# Patient Record
Sex: Female | Born: 1980 | Race: Black or African American | Hispanic: No | Marital: Married | Smoking: Never smoker
Health system: Southern US, Community
[De-identification: ages and names within clinical notes are randomized; demographics above are authoritative.]

## PROBLEM LIST (undated history)

## (undated) ENCOUNTER — Inpatient Hospital Stay (HOSPITAL_COMMUNITY): Payer: Self-pay

## (undated) DIAGNOSIS — Z789 Other specified health status: Secondary | ICD-10-CM

---

## 2013-10-06 DIAGNOSIS — Z9889 Other specified postprocedural states: Secondary | ICD-10-CM

## 2013-10-06 HISTORY — PX: DILATION AND CURETTAGE OF UTERUS: SHX78

## 2013-10-06 HISTORY — DX: Other specified postprocedural states: Z98.890

## 2017-01-19 ENCOUNTER — Ambulatory Visit: Payer: Self-pay | Admitting: Internal Medicine

## 2017-02-09 ENCOUNTER — Encounter: Payer: Self-pay | Admitting: Internal Medicine

## 2017-02-09 ENCOUNTER — Ambulatory Visit (INDEPENDENT_AMBULATORY_CARE_PROVIDER_SITE_OTHER): Payer: Self-pay | Admitting: Internal Medicine

## 2017-02-09 VITALS — BP 122/80 | HR 66 | Resp 12 | Ht 64.0 in | Wt 169.0 lb

## 2017-02-09 DIAGNOSIS — E663 Overweight: Secondary | ICD-10-CM | POA: Insufficient documentation

## 2017-02-09 MED ORDER — PRENATAL PLUS 27-1 MG PO TABS: 1.0000 | ORAL_TABLET | Freq: Every day | ORAL | 11 refills | Status: DC

## 2017-02-09 NOTE — Progress Notes (Signed)
   Subjective:    Patient ID: Ann Barr, female    DOB: 01/05/1981, 36 y.o.   MRN: [id number]  HPI   Originally from [place]  1.  Overweight:    Breakfast:  2% milk, coffee and white bread with some sort of sugar or a croissant.  Lunch:  White rice, couscous, tomato sauce with chicken or fish--baked.  Salad with 3 veggies, mayo with oil. Bread-white again  Dinner:  Same as lunch basically.  Drinks OJ once daily and rest is water.  Popcorn as a snack. Milk at bedtime.  Eat as a family much of the time  No outpatient prescriptions have been marked as taking for the 02/09/17 encounter (Office Visit) with Ann Barr, Ann Arp, MD.    No Known Allergies   No past medical history on file.   Past Surgical History:  Procedure Laterality Date  . 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   Social History   Social History  . Marital status: Married    Spouse name: Ann Barr  . Number of children: 4  . Years of education: N/A   Occupational History  . Housewife    Social History Main Topics  . Smoking status: Never Smoker  . Smokeless tobacco: Never Used  . Alcohol use Not on file  . Drug use: Unknown  . Sexual activity: Yes    Birth control/ protection: None   Other Topics Concern  . Not on file   Social History Narrative   [place]   [place]   Lives at home with husband, Ann SouHarouna, also a patient here and their 4 children.      Review of Systems     Objective:   Physical Exam  Lungs:  CTA CV:  RRR with normal S1 and S2, No S3, S4 or murmur, radial and DP pulses normal and equal Abd:  S, NT, No HSM or mass, + BS.  Low transverse surgical scar. LE:  No edema        Assessment & Plan:  1.  Establishing care.  2.  Concern for being able to get pregnant with history of surgery.  Appears to have had essentially a C section to treat  hemorrhaging from abruptio placenta (based on patient's history and physical.   As having unprotected sex, asked she take a PNV daily. Appears she should be fine for getting pregnant again.  3.  Overweight:  Encouraged lifestyle changes.  FLP, CMP, CBC  1-2 weeks fasting.

## 2017-02-09 NOTE — Patient Instructions (Signed)
Drink a glass of water before every meal Drink 6-8 glasses of water daily Eat three meals daily Eat a protein and healthy fat with every meal (eggs,fish, chicken, turkey and limit red meats) Eat 5 servings of vegetables daily, mix the colors Eat 2 servings of fruit daily with skin, if skin is edible Use smaller plates Put food/utensils down as you chew and swallow each bite Eat at a table with friends/family at least once daily, no TV Do not eat in front of the TV 

## 2017-02-12 ENCOUNTER — Telehealth: Payer: Self-pay | Admitting: Licensed Clinical Social Worker

## 2017-02-12 NOTE — Telephone Encounter (Signed)
LCSW called pt to introduce social work services at the clinic. Pt reported that she did not have any needs at this time.

## 2017-02-16 ENCOUNTER — Other Ambulatory Visit (INDEPENDENT_AMBULATORY_CARE_PROVIDER_SITE_OTHER): Payer: Self-pay

## 2017-02-16 DIAGNOSIS — E663 Overweight: Secondary | ICD-10-CM

## 2017-02-17 LAB — LIPID PANEL W/O CHOL/HDL RATIO
CHOLESTEROL TOTAL: 169 mg/dL (ref 100–199)
HDL: 64 mg/dL (ref >39)
LDL CALC: 94 mg/dL (ref 0–99)
TRIGLYCERIDES: 55 mg/dL (ref 0–149)
VLDL CHOLESTEROL CAL: 11 mg/dL (ref 5–40)

## 2017-02-17 LAB — CBC WITH DIFFERENTIAL/PLATELET
BASOS ABS: 0 10*3/uL (ref 0.0–0.2)
Basos: 1 %
EOS (ABSOLUTE): 0.2 10*3/uL (ref 0.0–0.4)
Eos: 3 %
Hematocrit: 35.6 % (ref 34.0–46.6)
Hemoglobin: 11.6 g/dL (ref 11.1–15.9)
IMMATURE GRANULOCYTES: 0 %
Immature Grans (Abs): 0 10*3/uL (ref 0.0–0.1)
LYMPHS ABS: 3 10*3/uL (ref 0.7–3.1)
Lymphs: 53 %
MCH: 27.8 pg (ref 26.6–33.0)
MCHC: 32.6 g/dL (ref 31.5–35.7)
MCV: 85 fL (ref 79–97)
Monocytes Absolute: 0.4 10*3/uL (ref 0.1–0.9)
Monocytes: 8 %
NEUTROS ABS: 1.9 10*3/uL (ref 1.4–7.0)
Neutrophils: 35 %
PLATELETS: 309 10*3/uL (ref 150–379)
RBC: 4.18 x10E6/uL (ref 3.77–5.28)
RDW: 13.7 % (ref 12.3–15.4)
WBC: 5.5 10*3/uL (ref 3.4–10.8)

## 2017-02-17 LAB — COMPREHENSIVE METABOLIC PANEL
ALK PHOS: 60 IU/L (ref 39–117)
ALT: 15 IU/L (ref 0–32)
AST: 17 IU/L (ref 0–40)
Albumin/Globulin Ratio: 1.5 (ref 1.2–2.2)
Albumin: 4.6 g/dL (ref 3.5–5.5)
BUN/Creatinine Ratio: 15 (ref 9–23)
BUN: 11 mg/dL (ref 6–20)
Bilirubin Total: 1.1 mg/dL (ref 0.0–1.2)
CALCIUM: 9.7 mg/dL (ref 8.7–10.2)
CO2: 27 mmol/L (ref 18–29)
CREATININE: 0.74 mg/dL (ref 0.57–1.00)
Chloride: 100 mmol/L (ref 96–106)
GFR calc Af Amer: 120 mL/min/{1.73_m2} (ref >59)
GFR, EST NON AFRICAN AMERICAN: 104 mL/min/{1.73_m2} (ref >59)
GLUCOSE: 91 mg/dL (ref 65–99)
Globulin, Total: 3.1 g/dL (ref 1.5–4.5)
Potassium: 4.6 mmol/L (ref 3.5–5.2)
Sodium: 139 mmol/L (ref 134–144)
Total Protein: 7.7 g/dL (ref 6.0–8.5)

## 2017-05-11 ENCOUNTER — Encounter: Payer: Self-pay | Admitting: Internal Medicine

## 2017-05-11 ENCOUNTER — Ambulatory Visit (INDEPENDENT_AMBULATORY_CARE_PROVIDER_SITE_OTHER): Payer: Self-pay | Admitting: Internal Medicine

## 2017-05-11 VITALS — BP 122/78 | HR 66 | Resp 12 | Ht 64.0 in | Wt 165.0 lb

## 2017-05-11 DIAGNOSIS — B353 Tinea pedis: Secondary | ICD-10-CM

## 2017-05-11 DIAGNOSIS — Z124 Encounter for screening for malignant neoplasm of cervix: Secondary | ICD-10-CM

## 2017-05-11 DIAGNOSIS — Z0000 Encounter for general adult medical examination without abnormal findings: Secondary | ICD-10-CM

## 2017-05-11 DIAGNOSIS — E663 Overweight: Secondary | ICD-10-CM

## 2017-05-11 DIAGNOSIS — E010 Iodine-deficiency related diffuse (endemic) goiter: Secondary | ICD-10-CM

## 2017-05-11 DIAGNOSIS — Z9189 Other specified personal risk factors, not elsewhere classified: Secondary | ICD-10-CM

## 2017-05-11 MED ORDER — TERBINAFINE HCL 1 % EX CREA: 1.0000 "application " | TOPICAL_CREAM | Freq: Two times a day (BID) | CUTANEOUS | 0 refills | Status: DC

## 2017-05-11 NOTE — Progress Notes (Signed)
Subjective:    Patient ID: Ann Barr, female    DOB: 10/24/1980, 36 y.o.   MRN: [id number]  HPI   CPE with pap  1.  Pap: Never.  No family history.  2.  Mammogram:  Checked in 2011 for left breast pain.  No abnormal findings.  No family history.  3.  Osteoprevention:  2 servings of milk daily.  Walks 30-40 minutes 3-4 times weekly.    4.  Guaiac Cards:  Never  5.  Colonoscopy:  Never.  No family history.  6.  Immunizations:  Not sure when her last tetanus vaccine.  Does have an immunization record at home.  7.  Glucose/Cholesterol: Serum Glucose and cholesterol panels were fine in May of this year.  Current Meds  Medication Sig  . prenatal vitamin w/FE, FA (PRENATAL 1 + 1) 27-1 MG TABS tablet Take 1 tablet by mouth daily at 12 noon.    No Known Allergies   History reviewed. No pertinent past medical history.   Past Surgical History:  Procedure Laterality Date  . CESAREAN SECTION  2015   Fetal demise with what sounds like abruptio placenta    History reviewed. No pertinent family history.   Social History   Social History  . Marital status: Married    Spouse name: [name]  . Number of children: 4  . 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 Smoking status: Never Smoker  . Smokeless tobacco: Never Used  . Alcohol use No  . Drug use: No  . Sexual activity: Yes    Birth control/ protection: None   Other Topics Concern  . Not on file   Social History Narrative   Originally from [place]   [place]   Lives at home with husband, Placido SouHarouna, also a patient here and their 4 children.     Review of Systems  Constitutional: Negative for appetite change, fatigue and unexpected weight change.  HENT: Negative for dental problem, ear pain, hearing loss, rhinorrhea, sinus pressure, sneezing and sore throat.   Eyes: Positive for visual  disturbance (wears glasses.  Last eye check was 4 years.  ).  Respiratory: Negative for cough and shortness of breath.   Cardiovascular: Negative for chest pain, palpitations and leg swelling.  Gastrointestinal: Negative for abdominal pain, blood in stool, constipation, diarrhea, nausea and vomiting.  Genitourinary: Negative for dysuria, frequency, menstrual problem (Generally lasting up to 8 days.  Was on BCPs before which caused to be shorter, but [facility] and vaginal discharge.  Musculoskeletal: Negative for arthralgias.  Skin: Positive for rash (white areas and itching in between toes . Has treated with medications she cannot recall.  Different creams. Husband with similar findings on feet.).  Neurological: Negative for weakness and numbness.  Hematological: Does not bruise/bleed easily.  Psychiatric/Behavioral: Negative for dysphoric mood. The patient is not nervous/anxious.        Objective:   Physical Exam  Constitutional: She is oriented to person, place, and time. She appears well-developed and well-nourished.  HENT:  Head: Normocephalic and atraumatic.  Right Ear: Hearing, tympanic membrane, external ear and ear canal normal.  Left Ear: Hearing, tympanic membrane, external ear and ear canal normal.  Nose: Nose normal.  Mouth/Throat: Uvula is midline, oropharynx is clear and moist and mucous membranes are normal.  Eyes: Pupils are equal, round, and reactive  to light. Conjunctivae and EOM are normal.  Discs sharp bilaterally  Neck: Normal range of motion and full passive range of motion without pain. Neck supple. Thyromegaly (no palpable mass, just generous in size) present. No thyroid mass present.  Cardiovascular: Normal rate, regular rhythm, S1 normal and S2 normal.  Exam reveals no S3, no S4 and no friction rub.   No murmur heard. Carotid, radial, femoral, DP and PT pulses normal and equal.   Pulmonary/Chest: Effort normal and breath sounds normal.  Right breast exhibits no inverted nipple, no mass, no nipple discharge, no skin change and no tenderness. Left breast exhibits no inverted nipple, no mass, no nipple discharge, no skin change and no tenderness.  Abdominal: Soft. Bowel sounds are normal. She exhibits no mass. There is no hepatosplenomegaly. There is no tenderness. No hernia.  Genitourinary:  Genitourinary Comments: Normal external genitalia.  No vaginal discharge.  No cervical lesions or CMT.  No uterine or adnexal mass or tenderness.   Musculoskeletal: Normal range of motion.  Lymphadenopathy:       Head (right side): No submental and no submandibular adenopathy present.       Head (left side): No submental and no submandibular adenopathy present.    She has no cervical adenopathy.    She has no axillary adenopathy.       Right: No inguinal and no supraclavicular adenopathy present.       Left: No inguinal and no supraclavicular adenopathy present.  Neurological: She is alert and oriented to person, place, and time. She has normal strength and normal reflexes. No cranial nerve deficit or sensory deficit. Coordination and gait normal.  Skin: Skin is warm and dry.  White, soft, fissured skin in between all toes of both feet.  Some scaling and dryness along lateral plantar aspects of both feet.  Psychiatric: She has a normal mood and affect. Her speech is normal and behavior is normal. Judgment and thought content normal. Cognition and memory are normal.          Assessment & Plan:  1.  CPE with pap She will check her papers at home regarding immunizations.  Not clear she is up to date on Td/Tdap  2.  Overweight:  Encouraged to work on diet and physical activity.  3.  Tinea pedis:  Terbinafine cream 0.1% twice daily for at least 14 days or until skin changes resolve.  To clean inside of shoes regularly with Lysol wipes and shower floor with bleach containing cleaner.  4.  Dental care:  Referral to dental clinic.  5.   Vision:  Checking with husband about $69 cost to see about optometry.

## 2017-05-11 NOTE — Patient Instructions (Addendum)
Drink a glass of water before every meal Drink 6-8 glasses of water daily Eat three meals daily Eat a protein and healthy fat with every meal (eggs,fish, chicken, [place] and limit red meats) Eat 5 servings of vegetables daily, mix the colors Eat 2 servings of fruit daily with skin, if skin is edible Use smaller plates Put food/utensils down as you chew and swallow each bite Eat at a table with friends/family at least once daily, no TV Do not eat in front of the TV  Tea Tree oil twice daily to dry feet--dry areas.

## 2017-05-12 LAB — TSH: TSH: 1.07 u[IU]/mL (ref 0.450–4.500)

## 2017-05-12 LAB — CYTOLOGY - PAP

## 2018-10-23 ENCOUNTER — Inpatient Hospital Stay (HOSPITAL_COMMUNITY)
Admission: AD | Admit: 2018-10-23 | Discharge: 2018-10-23 | Disposition: A | Discharge status: Home / Self Care | Payer: Medicaid Other | Source: Ambulatory Visit | Attending: Obstetrics and Gynecology | Admitting: Obstetrics and Gynecology

## 2018-10-23 ENCOUNTER — Encounter (HOSPITAL_COMMUNITY): Payer: Self-pay

## 2018-10-23 ENCOUNTER — Inpatient Hospital Stay (HOSPITAL_COMMUNITY): Payer: Medicaid Other

## 2018-10-23 DIAGNOSIS — R109 Unspecified abdominal pain: Secondary | ICD-10-CM | POA: Insufficient documentation

## 2018-10-23 DIAGNOSIS — O3680X0 Pregnancy with inconclusive fetal viability, not applicable or unspecified: Secondary | ICD-10-CM | POA: Diagnosis not present

## 2018-10-23 DIAGNOSIS — Z8759 Personal history of other complications of pregnancy, childbirth and the puerperium: Secondary | ICD-10-CM

## 2018-10-23 DIAGNOSIS — O26891 Other specified pregnancy related conditions, first trimester: Secondary | ICD-10-CM | POA: Diagnosis not present

## 2018-10-23 DIAGNOSIS — Z3A14 14 weeks gestation of pregnancy: Secondary | ICD-10-CM | POA: Diagnosis not present

## 2018-10-23 DIAGNOSIS — O209 Hemorrhage in early pregnancy, unspecified: Secondary | ICD-10-CM | POA: Insufficient documentation

## 2018-10-23 HISTORY — DX: Other specified health status: Z78.9

## 2018-10-23 LAB — CBC
HCT: 35.5 % — ABNORMAL LOW (ref 36.0–46.0)
Hemoglobin: 11.9 g/dL — ABNORMAL LOW (ref 12.0–15.0)
MCH: 29 pg (ref 26.0–34.0)
MCHC: 33.5 g/dL (ref 30.0–36.0)
MCV: 86.6 fL (ref 80.0–100.0)
PLATELETS: 262 10*3/uL (ref 150–400)
RBC: 4.1 MIL/uL (ref 3.87–5.11)
RDW: 12.2 % (ref 11.5–15.5)
WBC: 9.4 10*3/uL (ref 4.0–10.5)
nRBC: 0 % (ref 0.0–0.2)

## 2018-10-23 LAB — HCG, QUANTITATIVE, PREGNANCY: hCG, Beta Chain, Quant, S: 814 m[IU]/mL — ABNORMAL HIGH (ref <5)

## 2018-10-23 LAB — WET PREP, GENITAL
Clue Cells Wet Prep HPF POC: NONE SEEN
Sperm: NONE SEEN
Trich, Wet Prep: NONE SEEN
YEAST WET PREP: NONE SEEN

## 2018-10-23 LAB — URINALYSIS, ROUTINE W REFLEX MICROSCOPIC
Bilirubin Urine: NEGATIVE
GLUCOSE, UA: NEGATIVE mg/dL
Ketones, ur: NEGATIVE mg/dL
NITRITE: NEGATIVE
PH: 8 (ref 5.0–8.0)
PROTEIN: 30 mg/dL — AB
Specific Gravity, Urine: 1.011 (ref 1.005–1.030)

## 2018-10-23 LAB — POCT PREGNANCY, URINE: Preg Test, Ur: POSITIVE — AB

## 2018-10-23 LAB — ABO/RH: ABO/RH(D): O POS

## 2018-10-23 MED ORDER — TRAMADOL HCL 50 MG PO TABS: 100.0000 mg | ORAL_TABLET | Freq: Four times a day (QID) | ORAL | 0 refills | Status: DC | PRN

## 2018-10-23 MED ORDER — TRAMADOL HCL 50 MG PO TABS
100.0000 mg | ORAL_TABLET | Freq: Once | ORAL | Status: AC
  Administered 2018-10-23: 100 mg via ORAL
  Filled 2018-10-23: qty 2

## 2018-10-23 NOTE — Discharge Instructions (Signed)
Threatened Miscarriage    A threatened miscarriage is when you have bleeding from your vagina during the first 20 weeks of pregnancy but the pregnancy does not end. Your doctor will do tests to make sure you are still pregnant. The cause of the bleeding may not be known. This condition does not mean your pregnancy will end, but it does increase the risk that it will end (complete miscarriage).  Follow these instructions at home:  · Get plenty of rest.  · If you have bleeding in your vagina, do not have sex or use tampons.  · Do not douche.  · Do not smoke or use drugs.  · Do not drink alcohol.  · Avoid caffeine.  · Keep all follow-up prenatal visits as told by your doctor. This is important.  Contact a doctor if:  · You have light bleeding from your vagina.  · You have belly pain or cramping.  · You have a fever.  Get help right away if:  · You have heavy bleeding from your vagina.  · You have clots of blood coming from your vagina.  · You pass tissue from your vagina.  · You have a gush of fluid from your vagina.  · You are leaking fluid from your vagina.  · You have very bad pain or cramps in your low back or belly.  · You have fever, chills, and very bad belly pain.  Summary  · A threatened miscarriage is when you have bleeding from your vagina during the first 20 weeks of pregnancy but the pregnancy does not end.  · This condition does not mean your pregnancy will end, but it does increase the risk that it will end (complete miscarriage).  · Get plenty of rest. If you have bleeding in your vagina, do not have sex or use tampons.  · Keep all follow-up prenatal visits as told by your doctor. This is important.  This information is not intended to replace advice given to you by your health care provider. Make sure you discuss any questions you have with your health care provider.  Document Released: 09/04/2008 Document Revised: 12/19/2016 Document Reviewed: 12/19/2016  Elsevier Interactive Patient Education © 2019  [employer] Inc.

## 2018-10-23 NOTE — MAU Note (Signed)
Ann Barr is a 38 y.o.  here in [facility]U reporting:  +vaginal bleeding. Dark in color. Noticeable when wipes. Not having to wear a pad.  Denies recent IC. LMP: 07/13/18. Had first OB visit at "1100" office on Tuesday. Has another appointment this coming Tuesday. Was told then she was [redacted] weeks along. Onset of complaint: 10 am today for bleeding. Pain started last night. Pain score: 4-5/10. Lower abdominal pain. Cramping like a period. Intermittent. Vitals:   10/23/18 1435  BP: 119/75  Pulse: (!) 59  Resp: 16  Temp: 98.2 F (36.8 C)  SpO2: 100%     Lab orders placed from triage: ua and pregnancy test

## 2018-10-23 NOTE — MAU Provider Note (Signed)
Chief Complaint: Vaginal Bleeding and Abdominal Pain    First Provider Initiated Contact with Patient 10/23/18 1804       SUBJECTIVE HPI: Modene Andy is a 38 y.o. [id number] at [redacted]w[redacted]d who presents to Maternity Admissions reporting:  Vaginal Bleeding: 2 episodes of moderate vaginal bleeding last night that has decreased Passage of tissue or clots: Small clots. Dizziness: Denies  O POS Performed at [facility], [address] [place], [place] [address]   Pain Location: Left lower quadrant Quality: Cramping Severity: 5/10 on pain scale at worst.  None now.  Duration: 12 hours Course: Unchanged Context: Early pregnancy.  IUP not confirmed. Timing: Intermittent Modifying factors: Nothing.  Has not tried anything to treat the pain Associated signs and symptoms: Positive for vaginal bleeding.  Negative for fever, chills, urinary complaints, GI complaints or vaginal discharge.  States she had an appointment at the [facility] department where they drew blood, has not gotten any results.  Denies having any ultrasounds this pregnancy.  Has not had fetal heart rate Dopplered yet.  Phone [place] interpreter used.  Past Medical History:  Diagnosis Date  . Medical history non-contributory    OB History  Gravida Para Term Preterm AB Living  6 5 4 1   4   SAB TAB Ectopic Multiple Live Births               # Outcome Date GA Lbr Len/2nd Weight Sex Delivery Anes PTL Lv  6 Current           5 Preterm 03/23/14     CS-LTranv   FD  4 Term           3 Term           2 Term           1 Term            Past Surgical History:  Procedure Laterality Date  . 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 Marital status: Married    Spouse name: [name]  . Number of children: 4  . Years of education: associates degree in accounting  . Highest education level: Not on file  Occupational History  .  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 Financial resource strain: Not on file  . Food insecurity:    Worry: Not on file    Inability: Not on file  . Transportation needs:    Medical: Not on file    Non-medical: Not on file  Tobacco Use  . Smoking status: Never Smoker  . Smokeless tobacco: Never Used  Substance and Sexual Activity  . Alcohol use: No  . Drug use: No  . Sexual activity: Yes    Birth control/protection: None  Lifestyle  . Physical activity:    Days per week: Not on file    Minutes per session: Not on file  . Stress: Not on file  Relationships  . Social connections:    Talks on phone: Not on file    Gets together: Not on file    Attends religious service: Not on file    Active member of club or organization: Not on file    Attends meetings of clubs or organizations: Not on file    Relationship status: Not on file  . Intimate partner violence:    Fear of current or ex partner: Not on file  Emotionally abused: Not on file    Physically abused: Not on file    Forced sexual activity: Not on file  Other Topics Concern  . Not on file  Social History Narrative   Originally [place]   [place]   Lives at home with husband, Placido SouHarouna, also a patient here and their 4 children.   No current facility-administered medications on file prior to encounter.    Current Outpatient Medications on File Prior to Encounter  Medication Sig Dispense Refill  . prenatal vitamin w/FE, FA (PRENATAL 1 + 1) 27-1 MG TABS tablet Take 1 tablet by mouth daily at 12 noon. 30 each 11  . terbinafine (LAMISIL) 1 % cream Apply 1 application topically 2 (two) times daily. 30 g 0   No Known Allergies  I have reviewed the past Medical Hx, Surgical Hx, Social Hx, Allergies and Medications.   Review of Systems  Constitutional: Negative for chills and fever.  Gastrointestinal: Positive for abdominal pain. Negative for constipation, diarrhea, nausea  and vomiting.  Genitourinary: Positive for vaginal bleeding. Negative for dysuria, flank pain, frequency, hematuria, pelvic pain, urgency and vaginal discharge.  Musculoskeletal: Negative for back pain.  Neurological: Negative for dizziness.    OBJECTIVE Patient Vitals for the past 24 hrs:  BP Temp Temp src Pulse Resp SpO2 Weight  10/23/18 1901 134/82 - - - - - -  10/23/18 1435 119/75 98.2 F (36.8 C) Oral (!) 59 16 100 % 74.4 kg   Constitutional: Well-developed, well-nourished female in no acute distress.  Cardiovascular: normal rate Respiratory: normal rate and effort.  GI: Abd soft, non-tender. Pos BS x 4.  Fundus not palpable.  Healed low transverse incision. MS: Extremities nontender, no edema, normal ROM Neurologic: Alert and oriented x 4.  GU: Neg CVAT.  SPECULUM EXAM: NEFG, physiologic discharge, small amount of dark red blood noted, cervix clean  BIMANUAL: cervix closed; uterus slightly enlarged, not consistent with 14 weeks, no adnexal tenderness or masses. No CMT.  LAB RESULTS Results for orders placed or performed during the hospital encounter of 10/23/18 (from the past 24 hour(s))  Urinalysis, Routine w reflex microscopic     Status: Abnormal   Collection Time: 10/23/18  2:45 PM  Result Value Ref Range   Color, Urine YELLOW YELLOW   APPearance CLOUDY (A) CLEAR   Specific Gravity, Urine 1.011 1.005 - 1.030   pH 8.0 5.0 - 8.0   Glucose, UA NEGATIVE NEGATIVE mg/dL   Hgb urine dipstick LARGE (A) NEGATIVE   Bilirubin Urine NEGATIVE NEGATIVE   Ketones, ur NEGATIVE NEGATIVE mg/dL   Protein, ur 30 (A) NEGATIVE mg/dL   Nitrite NEGATIVE NEGATIVE   Leukocytes, UA SMALL (A) NEGATIVE   RBC / HPF >50 (H) 0 - 5 RBC/hpf   WBC, UA 21-50 0 - 5 WBC/hpf   Bacteria, UA RARE (A) NONE SEEN   Squamous Epithelial / LPF 0-5 0 - 5   Amorphous Crystal PRESENT   Pregnancy, urine POC     Status: Abnormal   Collection Time: 10/23/18  2:46 PM  Result Value Ref Range   Preg Test, Ur  POSITIVE (A) NEGATIVE  Wet prep, genital     Status: Abnormal   Collection Time: 10/23/18  3:56 PM  Result Value Ref Range   Yeast Wet Prep HPF POC NONE SEEN NONE SEEN   Trich, Wet Prep NONE SEEN NONE SEEN   Clue Cells Wet Prep HPF POC NONE SEEN NONE SEEN   WBC,  Wet Prep HPF POC FEW (A) NONE SEEN   Sperm NONE SEEN   hCG, quantitative, pregnancy     Status: Abnormal   Collection Time: 10/23/18  3:57 PM  Result Value Ref Range   hCG, Beta Chain, Quant, S 814 (H) <5 mIU/mL  CBC     Status: Abnormal   Collection Time: 10/23/18  3:57 PM  Result Value Ref Range   WBC 9.4 4.0 - 10.5 K/uL   RBC 4.10 3.87 - 5.11 MIL/uL   Hemoglobin 11.9 (L) 12.0 - 15.0 g/dL   HCT 14.7 (L) 82.9 - 56.2 %   MCV 86.6 80.0 - 100.0 fL   MCH 29.0 26.0 - 34.0 pg   MCHC 33.5 30.0 - 36.0 g/dL   RDW 13.0 86.5 - 78.4 %   Platelets 262 150 - 400 K/uL   nRBC 0.0 0.0 - 0.2 %  ABO/Rh     Status: None (Preliminary result)   Collection Time: 10/23/18  3:57 PM  Result Value Ref Range   ABO/RH(D)      O POS Performed at [facility], [address], [place], [place] [address]     IMAGING US Ob Comp Less 14 Wks  Result Date: 10/23/2018 CLINICAL DATA:  By report, the patient is [redacted] weeks pregnant by last menstrual cycle. The beta HCG is 114. The patient presents with bleeding. EXAM: OBSTETRIC <14 WK [place] AND TRANSVAGINAL OB US TECHNIQUE: Both transabdominal and transvaginal ultrasound examinations were performed for complete evaluation of the gestation as well as the maternal uterus, adnexal regions, and pelvic cul-de-sac. Transvaginal technique was performed to assess early pregnancy. COMPARISON:  None. FINDINGS: There is a heterogeneous masslike collection in the lower uterine segment which demonstrates mixed echogenicity. There appears to be a thin rim of surrounding fluid with a central large region which is largely hypoechoic. Hyperechoic central regions are identified as well. This finding is just above the cervix. No  normal IUP is identified. No fluid outside of the uterus within the pelvis. The ovaries are unremarkable in appearance. IMPRESSION: A normal IUP is not identified. There is a 5.4 x 4.6 x 4.4 cm heterogeneous masslike collection in the lower uterine segment just above the cervix. While nonspecific, I suspect this represents a spontaneous abortion in process given history. However, the ultrasound findings are unusual and clinical correlation is recommended. Also, recommend a short-term follow-up ultrasound. If this is truly a spontaneous abortion in process, the uterus should return to normal appearance and the masslike collection should resolve. Electronically Signed   By: Gerome Sam III M.D   On: 10/23/2018 17:59   US Ob Transvaginal  Result Date: 10/23/2018 CLINICAL DATA:  By report, the patient is [redacted] weeks pregnant by last menstrual cycle. The beta HCG is 114. The patient presents with bleeding. EXAM: OBSTETRIC <14 WK [place] AND TRANSVAGINAL OB US TECHNIQUE: Both transabdominal and transvaginal ultrasound examinations were performed for complete evaluation of the gestation as well as the maternal uterus, adnexal regions, and pelvic cul-de-sac. Transvaginal technique was performed to assess early pregnancy. COMPARISON:  None. FINDINGS: There is a heterogeneous masslike collection in the lower uterine segment which demonstrates mixed echogenicity. There appears to be a thin rim of surrounding fluid with a central large region which is largely hypoechoic. Hyperechoic central regions are identified as well. This finding is just above the cervix. No normal IUP is identified. No fluid outside of the uterus within the pelvis. The ovaries are unremarkable in appearance. IMPRESSION: A normal IUP is  not identified. There is a 5.4 x 4.6 x 4.4 cm heterogeneous masslike collection in the lower uterine segment just above the cervix. While nonspecific, I suspect this represents a spontaneous abortion in process given  history. However, the ultrasound findings are unusual and clinical correlation is recommended. Also, recommend a short-term follow-up ultrasound. If this is truly a spontaneous abortion in process, the uterus should return to normal appearance and the masslike collection should resolve. Electronically Signed   By: Gerome Samavid  Williams III M.D   On: 10/23/2018 17:59    MAU COURSE CBC, Quant, ABO/Rh, ultrasound, wet prep and GC/chlamydia culture, UA, and culture.   Meds ordered this encounter  Medications  . traMADol (ULTRAM) tablet 100 mg  . traMADol (ULTRAM) 50 MG tablet    Sig: Take 2 tablets (100 mg total) by mouth every 6 (six) hours as needed for severe pain.    Dispense:  30 tablet    Refill:  0    Order Specific Question:   Supervising Provider    Answer:   CONSTANT, PEGGY [[id number]]     MDM Pain and bleeding in early pregnancy with pregnancy of unknown anatomic location, but hemodynamically stable.  Masslike collection in lower uterine segment suspicious for SAB in progress.  Will get hCG in 48 hours and plan follow-up ultrasound at some point to evaluate for resolution of mass.  Urine culture/GC/chlamydia cultures pending.  ASSESSMENT 1. Pregnancy of unknown anatomic location   2. Vaginal bleeding in pregnancy, first trimester   3. Abdominal pain during pregnancy in first trimester     PLAN Discharge home in stable condition. Ectopic and SAB precautions Follow-up Information    [facility].   Specialty:  Obstetrics and Gynecology Why:  at 4:00 pm. Please allow 2 hours for this visit so that you can receive your results and plan of care.  Contact information: [address] [place] [id number] [phone]       [facility] Follow up.   Specialty:  Obstetrics and Gynecology Why:  As needed in pregnancy emergencies (severe bleeding, severe pain or fever greater than 100.4) Contact  information: [address] [id number] mc [place] [id number] [phone]         Allergies as of 10/23/2018   No Known Allergies     Medication List    STOP taking these medications   terbinafine 1 % cream Commonly known as:  LAMISIL     TAKE these medications   prenatal vitamin w/FE, FA 27-1 MG Tabs tablet Take 1 tablet by mouth daily at 12 noon.   traMADol 50 MG tablet Commonly known as:  ULTRAM Take 2 tablets (100 mg total) by mouth every 6 (six) hours as needed for severe pain.        Katrinka BlazingSmith, [place], [place] 10/23/2018  7:17 PM  4

## 2018-10-24 LAB — HIV ANTIBODY (ROUTINE TESTING W REFLEX): HIV SCREEN 4TH GENERATION: NONREACTIVE

## 2018-10-25 ENCOUNTER — Ambulatory Visit (INDEPENDENT_AMBULATORY_CARE_PROVIDER_SITE_OTHER): Payer: Medicaid Other | Admitting: *Deleted

## 2018-10-25 DIAGNOSIS — O3680X0 Pregnancy with inconclusive fetal viability, not applicable or unspecified: Secondary | ICD-10-CM

## 2018-10-25 LAB — GC/CHLAMYDIA PROBE AMP (~~LOC~~) NOT AT ARMC
Chlamydia: NEGATIVE
NEISSERIA GONORRHEA: NEGATIVE

## 2018-10-25 LAB — CULTURE, OB URINE: Culture: NO GROWTH

## 2018-10-25 LAB — HCG, QUANTITATIVE, PREGNANCY: hCG, Beta Chain, Quant, S: 631 m[IU]/mL — ABNORMAL HIGH (ref <5)

## 2018-10-25 NOTE — Progress Notes (Addendum)
Here for stat bhcg.Used [place] interpreter [phone].  C/o cramping  pain and heavy bleeding =10 this am , but now c/oo very light bleeding and cramping =4.. Explained we will draw stat bhcg and have her wait in lobby for results which we will then discuss with provider and then her. She voices understanding. Discussed with Dr. Erin Fulling and will proceed with stat bhcg.  Bonita Quin, RN   Reviewed stat bhcg with Dr. Burnice Logan Katrinka Blazing . Informed patient with [employer] interpreter Sheran Lawless [phone] that  due to drop in bhcg, bleeding and cramping it does appear she is having a miscarriage as suspected previously.  Support given. Informed her come back 1 week for routhine bhcg, 2 weeks to see provider . Also instructed her to report to mau for severe pain or very heavy bleeding.  She voices understanding.  Bonita Quin , RN  Attestation of Attending Supervision of RN: Evaluation and management procedures were performed by the nurse under my supervision and collaboration.  I have reviewed the nursing note and chart, and I agree with the management and plan.  Carolyn L. Harraway-Smith, M.D., [place]n Core

## 2018-11-01 ENCOUNTER — Other Ambulatory Visit: Payer: Self-pay | Admitting: *Deleted

## 2018-11-01 ENCOUNTER — Other Ambulatory Visit: Payer: Medicaid Other

## 2018-11-01 DIAGNOSIS — O3680X0 Pregnancy with inconclusive fetal viability, not applicable or unspecified: Secondary | ICD-10-CM

## 2018-11-02 LAB — BETA HCG QUANT (REF LAB): hCG Quant: 450 m[IU]/mL

## 2018-11-08 ENCOUNTER — Encounter: Payer: Self-pay | Admitting: Advanced Practice Midwife

## 2018-11-08 ENCOUNTER — Ambulatory Visit (INDEPENDENT_AMBULATORY_CARE_PROVIDER_SITE_OTHER): Payer: Medicaid Other | Admitting: Advanced Practice Midwife

## 2018-11-08 VITALS — BP 129/86 | HR 61 | Ht 66.0 in | Wt 167.0 lb

## 2018-11-08 DIAGNOSIS — O039 Complete or unspecified spontaneous abortion without complication: Secondary | ICD-10-CM

## 2018-11-08 MED ORDER — PRENATAL VITAMIN PLUS LOW IRON 27-1 MG PO TABS: 1.0000 | ORAL_TABLET | Freq: Every day | ORAL | 11 refills | Status: DC

## 2018-11-08 NOTE — Progress Notes (Signed)
  Subjective:     Patient ID: Ann Barr, female   DOB: 1981/04/13, 38 y.o.   MRN: [id number]  Teashia Trzaska is a 38 y.o. [id number] here for follow-up after SAB on 10/23/2018. She reports cessation of bleeding and cramping since 11/05/2018. She expressed sadness at another loss (this is her first pregnancy since a fetal demise in 2015) and had questions about why she did not see products of conception in what she passed, but verbalized understanding once [place] results were explained fully. She plans to continue trying to get pregnancy and will seek care early once she gets a positive HPT.    Review of Systems  Constitutional: Negative.  Negative for activity change, appetite change, fatigue and fever.  HENT: Negative.   Eyes: Negative.  Negative for photophobia and visual disturbance.  Respiratory: Negative.  Negative for shortness of breath.   Cardiovascular: Negative.  Negative for chest pain and leg swelling.  Gastrointestinal: Negative.  Negative for nausea and vomiting.  Endocrine: Negative.   Genitourinary: Negative.  Negative for pelvic pain, vaginal bleeding and vaginal discharge.  Musculoskeletal: Negative.   Allergic/Immunologic: Negative.   Neurological: Negative.  Negative for headaches.  Hematological: Negative.   Psychiatric/Behavioral: Negative.        Objective:   Physical Exam Vitals signs and nursing note reviewed.  Constitutional:      General: She is not in acute distress.    Appearance: Normal appearance.  HENT:     Head: Normocephalic.  Cardiovascular:     Rate and Rhythm: Normal rate.  Pulmonary:     Effort: Pulmonary effort is normal.  Skin:    General: Skin is dry.  Neurological:     Mental Status: She is alert and oriented to person, place, and time.  Psychiatric:        Mood and Affect: Mood normal.        Behavior: Behavior normal.        Thought Content: Thought content normal.        Judgment: Judgment normal.        Assessment:     1. SAB  (spontaneous abortion)     Plan:  - CBC - Quantitative Hcg  - Prenatal vitamins sent to pharmacy for future conception planning

## 2018-11-08 NOTE — Patient Instructions (Signed)
Miscarriage  A miscarriage is the loss of an unborn baby (fetus) before the 20th week of pregnancy.  Follow these instructions at home:  Medicines    · Take over-the-counter and prescription medicines only as told by your doctor.  · If you were prescribed antibiotic medicine, take it as told by your doctor. Do not stop taking the antibiotic even if you start to feel better.  · Do not take NSAIDs unless your doctor says that this is safe for you. NSAIDs include aspirin and ibuprofen. These medicines can cause bleeding.  Activity  · Rest as directed. Ask your doctor what activities are safe for you.  · Have someone help you at home during this time.  General instructions  · Write down how many pads you use each day and how soaked they are.  · Watch the amount of tissue or clumps of blood (blood clots) that you pass from your vagina. Save any large amounts of tissue for your doctor.  · Do not use tampons, douche, or have sex until your doctor approves.  · To help you and your partner with the process of grieving, talk with your doctor or seek counseling.  · When you are ready, meet with your doctor to talk about steps you should take for your health. Also, talk with your doctor about steps to take to have a healthy pregnancy in the future.  · Keep all follow-up visits as told by your doctor. This is important.  Contact a doctor if:  · You have a fever or chills.  · You have vaginal discharge that smells bad.  · You have more bleeding.  Get help right away if:  · You have very bad cramps or pain in your back or belly.  · You pass clumps of blood that are walnut-sized or larger from your vagina.  · You pass tissue that is walnut-sized or larger from your vagina.  · You soak more than 1 regular pad in an hour.  · You get light-headed or weak.  · You faint (pass out).  · You have feelings of sadness that do not go away, or you have thoughts of hurting yourself.  Summary  · A miscarriage is the loss of an unborn baby before  the 20th week of pregnancy.  · Follow your doctor's instructions for home care. Keep all follow-up appointments.  · To help you and your partner with the process of grieving, talk with your doctor or seek counseling.  This information is not intended to replace advice given to you by your health care provider. Make sure you discuss any questions you have with your health care provider.  Document Released: 12/15/2011 Document Revised: 10/28/2016 Document Reviewed: 10/28/2016  Elsevier Interactive Patient Education © 2019 Elsevier Inc.

## 2018-11-09 LAB — CBC
HEMATOCRIT: 35.4 % (ref 34.0–46.6)
Hemoglobin: 11.6 g/dL (ref 11.1–15.9)
MCH: 28.2 pg (ref 26.6–33.0)
MCHC: 32.8 g/dL (ref 31.5–35.7)
MCV: 86 fL (ref 79–97)
Platelets: 252 10*3/uL (ref 150–450)
RBC: 4.11 x10E6/uL (ref 3.77–5.28)
RDW: 11.6 % — ABNORMAL LOW (ref 11.7–15.4)
WBC: 7.8 10*3/uL (ref 3.4–10.8)

## 2018-11-09 LAB — BETA HCG QUANT (REF LAB): hCG Quant: 423 m[IU]/mL

## 2018-11-18 ENCOUNTER — Other Ambulatory Visit: Payer: Medicaid Other

## 2018-11-18 ENCOUNTER — Other Ambulatory Visit: Payer: Self-pay | Admitting: General Practice

## 2018-11-18 DIAGNOSIS — O039 Complete or unspecified spontaneous abortion without complication: Secondary | ICD-10-CM

## 2018-11-19 LAB — BETA HCG QUANT (REF LAB): hCG Quant: 312 m[IU]/mL

## 2018-11-24 ENCOUNTER — Telehealth: Payer: Self-pay | Admitting: General Practice

## 2018-11-24 DIAGNOSIS — O034 Incomplete spontaneous abortion without complication: Secondary | ICD-10-CM

## 2018-11-24 NOTE — Telephone Encounter (Signed)
-----   Message from Armando Reichert, CNM sent at 11/24/2018  8:21 AM EST ----- Patient needs to continue with weekly hcg checks. I would also like her to get an [place] to check for retained POCs due to hcgs not falling as anticipated.

## 2018-11-24 NOTE — Telephone Encounter (Signed)
Scheduled appt 2/28 @ 8am. Called patient with pac[place] interpreter [phone] and informed patient of results with insufficient decrease. Discussed need for follow up bhcg and ultrasound appt. Patient verbalized understanding to all & states she will come 2/27 @ 8am for blood work. Patient had no questions.

## 2018-12-02 ENCOUNTER — Ambulatory Visit: Payer: Medicaid Other

## 2018-12-02 ENCOUNTER — Other Ambulatory Visit: Payer: Medicaid Other

## 2018-12-02 DIAGNOSIS — O034 Incomplete spontaneous abortion without complication: Secondary | ICD-10-CM

## 2018-12-03 ENCOUNTER — Other Ambulatory Visit: Payer: Self-pay | Admitting: Obstetrics & Gynecology

## 2018-12-03 ENCOUNTER — Ambulatory Visit (HOSPITAL_COMMUNITY)
Admission: RE | Admit: 2018-12-03 | Discharge: 2018-12-03 | Disposition: A | Discharge status: Home / Self Care | Payer: Medicaid Other | Source: Ambulatory Visit | Attending: Advanced Practice Midwife | Admitting: Advanced Practice Midwife

## 2018-12-03 ENCOUNTER — Ambulatory Visit (INDEPENDENT_AMBULATORY_CARE_PROVIDER_SITE_OTHER): Payer: Medicaid Other | Admitting: Obstetrics & Gynecology

## 2018-12-03 DIAGNOSIS — O034 Incomplete spontaneous abortion without complication: Secondary | ICD-10-CM | POA: Insufficient documentation

## 2018-12-03 LAB — BETA HCG QUANT (REF LAB): HCG QUANT: 205 m[IU]/mL

## 2018-12-03 MED ORDER — ACETAMINOPHEN-CODEINE #3 300-30 MG PO TABS: 1.0000 | ORAL_TABLET | ORAL | 0 refills | Status: DC | PRN

## 2018-12-03 MED ORDER — PROMETHAZINE HCL 12.5 MG PO TABS: 12.5000 mg | ORAL_TABLET | Freq: Four times a day (QID) | ORAL | 0 refills | Status: DC | PRN

## 2018-12-03 MED ORDER — MISOPROSTOL 200 MCG PO TABS: ORAL_TABLET | ORAL | 0 refills | Status: DC

## 2018-12-03 MED ORDER — IBUPROFEN 600 MG PO TABS: 600.0000 mg | ORAL_TABLET | Freq: Four times a day (QID) | ORAL | 1 refills | Status: DC | PRN

## 2018-12-03 NOTE — Progress Notes (Signed)
   Subjective:    Patient ID: Ann Barr, female    DOB: 1981/01/23, 38 y.o.   MRN: [id number]  HPI  38 yo married G6P4A2 here for followup for u/s done today for possible retained POC. She is not having any bleeding. Her QBHCGs are decreasing.   Review of Systems     Objective:   Physical Exam Breathing, conversing, and ambulating normally Well nourished, well hydrated Black female, no apparent distress Blood type is O+     Assessment & Plan:  Retained POC- I offered watchful waiting, cytotec, and d&c. She opts for cytotec Come back in 4 weeks for [facility] She declines a flu vaccine today

## 2018-12-31 ENCOUNTER — Encounter: Payer: Self-pay | Admitting: *Deleted

## 2019-02-09 ENCOUNTER — Telehealth: Payer: Self-pay | Admitting: Obstetrics and Gynecology

## 2019-02-09 NOTE — Telephone Encounter (Signed)
The patient stated she is bleeding pink tissue from the miscarriage she previously had. She stated she was not cleaned properly and has been bleeding for 9 days.

## 2019-02-14 ENCOUNTER — Telehealth: Payer: Self-pay | Admitting: Lactation Services

## 2019-02-14 NOTE — Telephone Encounter (Addendum)
Returned patients call in regards to her call last week c/o bleeding pink tissue post miscarriage. Spoke with patient with Financial risk analyst # [phone].   Pt reports bleeding started on 4/28 and stopped 3 days ago. Pt reports that on the 30th, she felt like she had something like an embryo came out.   Pt reports bleeding Jan 18th and reports the bleeding stopped on January 18. She went to the hospital and blood was taken to confirm a pregnancy. [place] was performed and they told her that the embryo was still in place at that time.   Pt had [place] on 2/28 and reported there was "tissue" in her uterus.  [place] report does show retained product of conception. Pt saw Dr. Marice Potter on 4/28 and placed the Cytotec on 4/29 as ordered.   On the 28th she thought it was her regular period. Then on the 30th tissue "like an embryo" was expelled.   Pt reports she is not bleeding currently. Pt reports she is feeling fine but is concerned tissue was expelled.   Spoke with Dr. Marice Potter and she requested pt come in for non stat Beta Hcg ASAP. Pt requested that she come in about 4 pm or as late as possible. Message to [facility] to call pt and schedule non stat Bets Hcg.

## 2019-02-21 ENCOUNTER — Telehealth: Payer: Self-pay | Admitting: Obstetrics & Gynecology

## 2019-02-21 NOTE — Telephone Encounter (Signed)
Called patient and informed her an appointment tomorrow 08/19 at 3:30 for a blood draw. Patient stated , ok.

## 2019-02-22 ENCOUNTER — Other Ambulatory Visit: Payer: Self-pay

## 2019-02-22 ENCOUNTER — Other Ambulatory Visit: Payer: Medicaid Other

## 2019-02-22 DIAGNOSIS — O039 Complete or unspecified spontaneous abortion without complication: Secondary | ICD-10-CM

## 2019-02-23 LAB — BETA HCG QUANT (REF LAB): hCG Quant: 14 m[IU]/mL

## 2020-02-28 DIAGNOSIS — Z23 Encounter for immunization: Secondary | ICD-10-CM | POA: Diagnosis not present

## 2020-03-20 DIAGNOSIS — Z23 Encounter for immunization: Secondary | ICD-10-CM | POA: Diagnosis not present

## 2020-07-25 IMAGING — US US OB TRANSVAGINAL
1 series · 15 of 28 positions shown · non-contrast
Comparison: None.

CLINICAL DATA: By report, the patient is 14 weeks pregnant by last
menstrual cycle. The beta HCG is 114. The patient presents with
bleeding.

EXAM:
OBSTETRIC <14 WK US AND TRANSVAGINAL OB US
TECHNIQUE: Both transabdominal and transvaginal ultrasound examinations were
performed for complete evaluation of the gestation as well as the
maternal uterus, adnexal regions, and pelvic cul-de-sac.
Transvaginal technique was performed to assess early pregnancy.

[Series 1: us ob transvaginal · 15 of 97 slices shown]
[im 1/97]
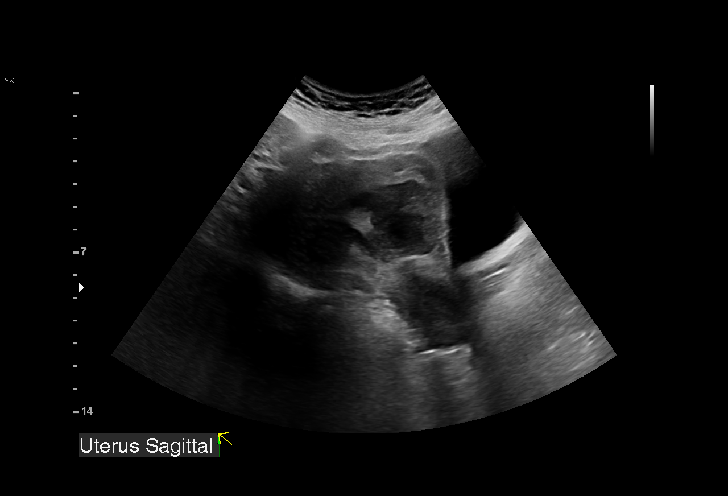
[im 8/97]
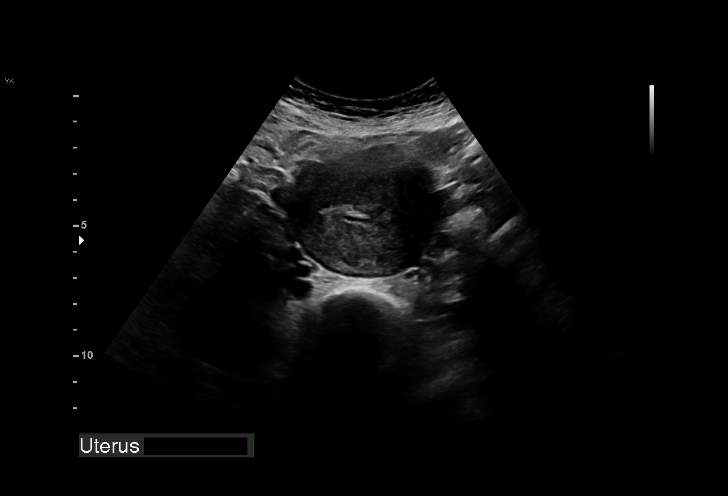
[im 15/97]
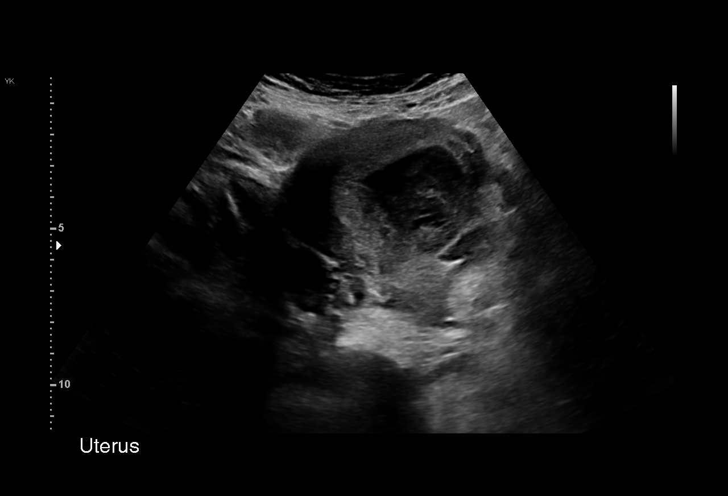
[im 22/97]
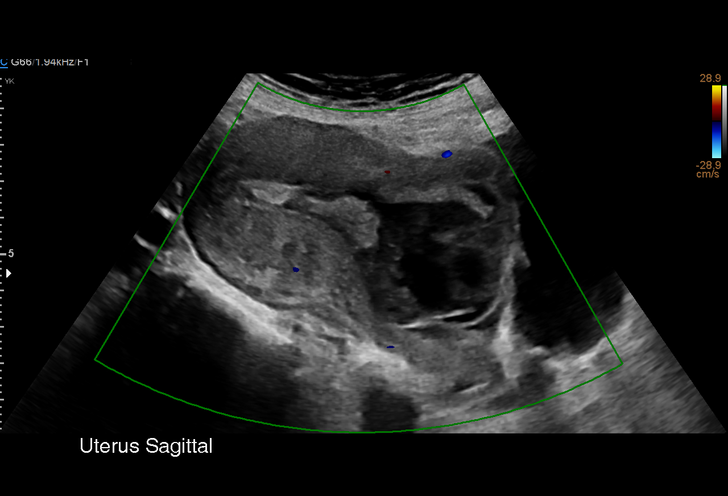
[im 29/97]
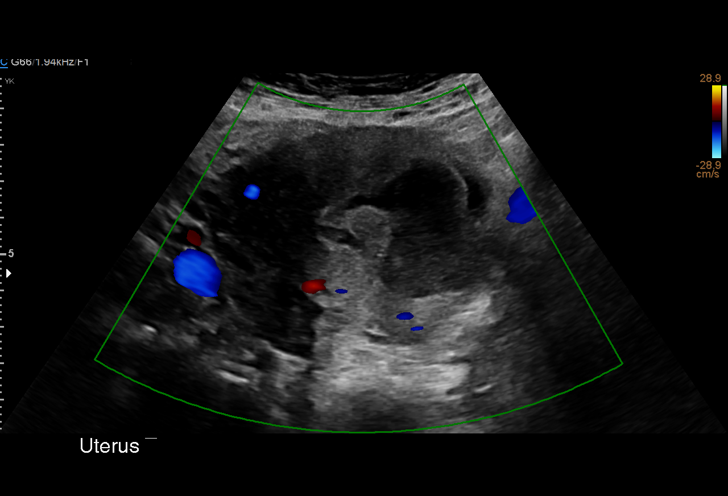
[im 36/97]
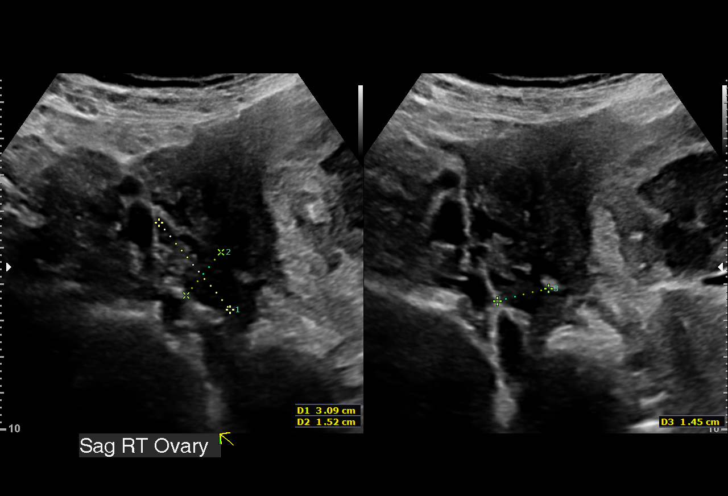
[im 43/97]
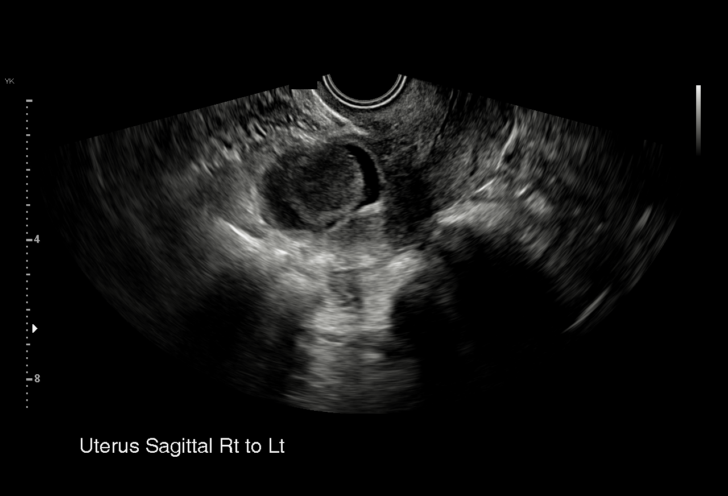
[im 50/97]
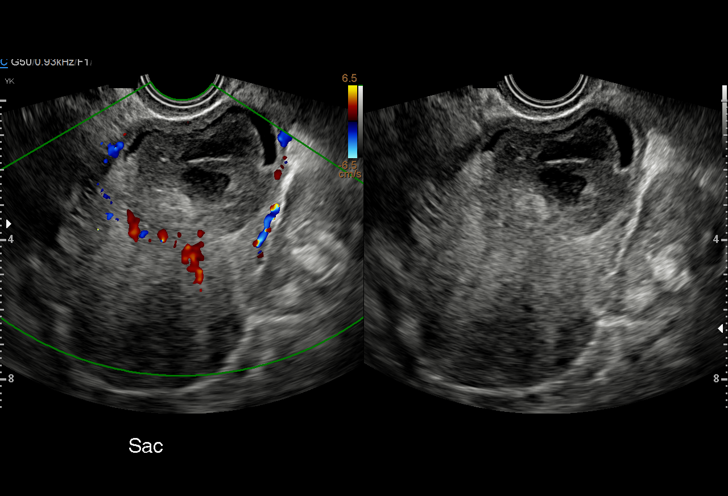
[im 54/97]
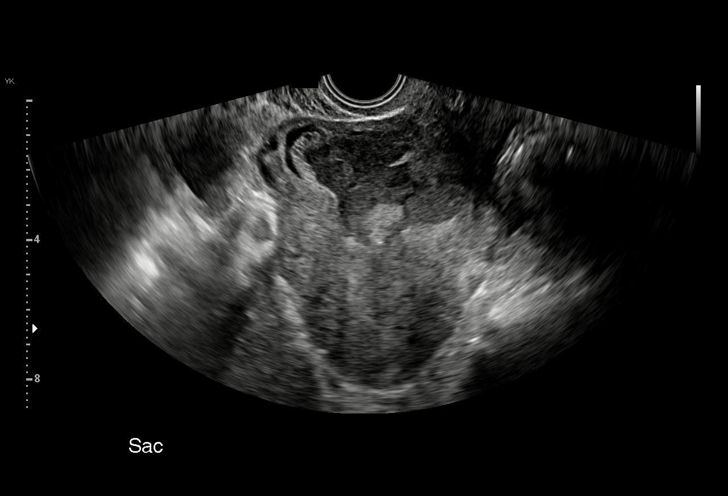
[im 61/97]
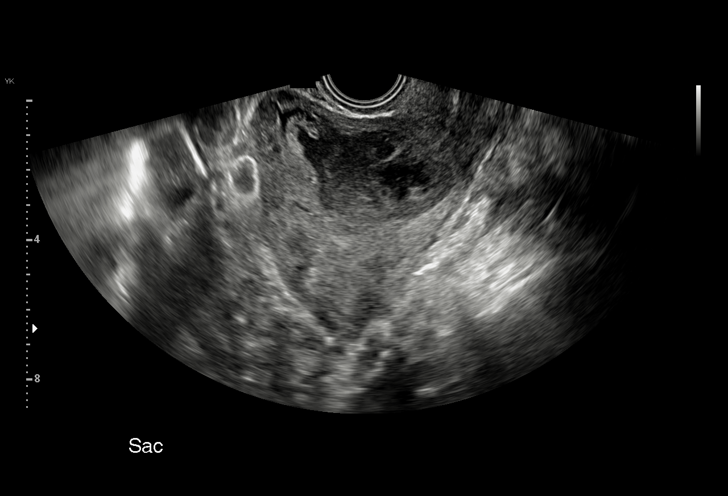
[im 68/97]
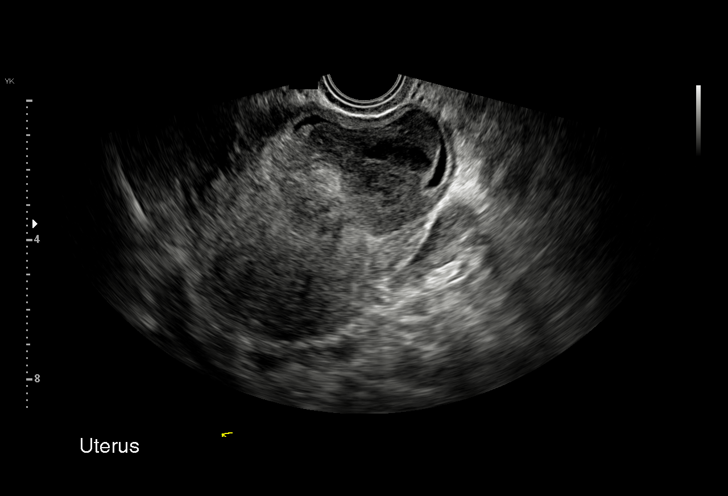
[im 75/97]
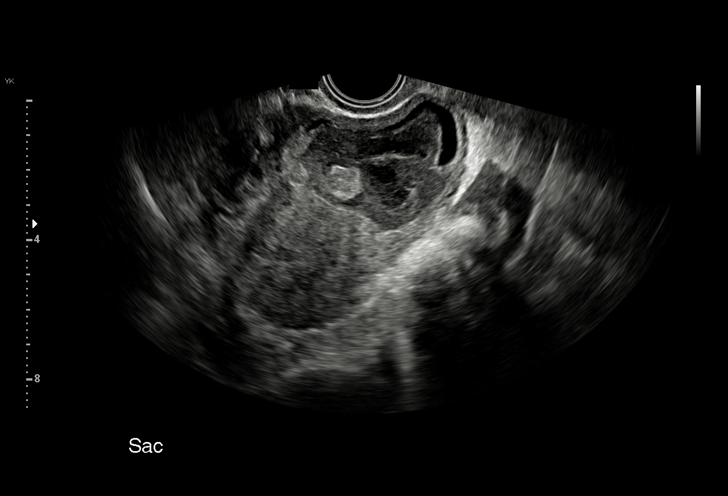
[im 82/97]
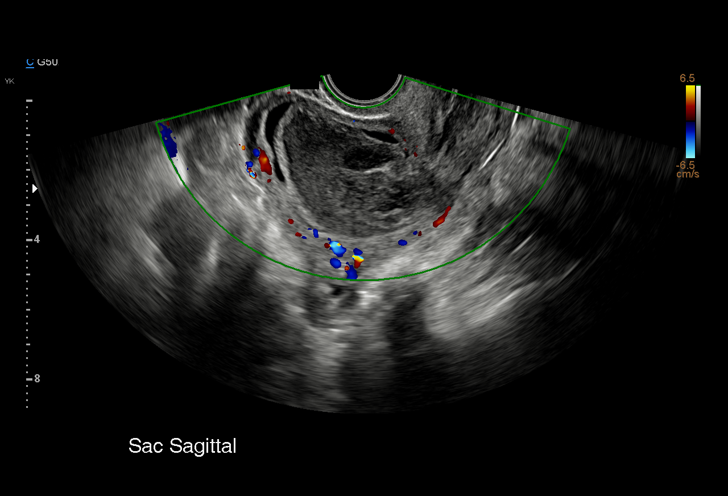
[im 89/97]
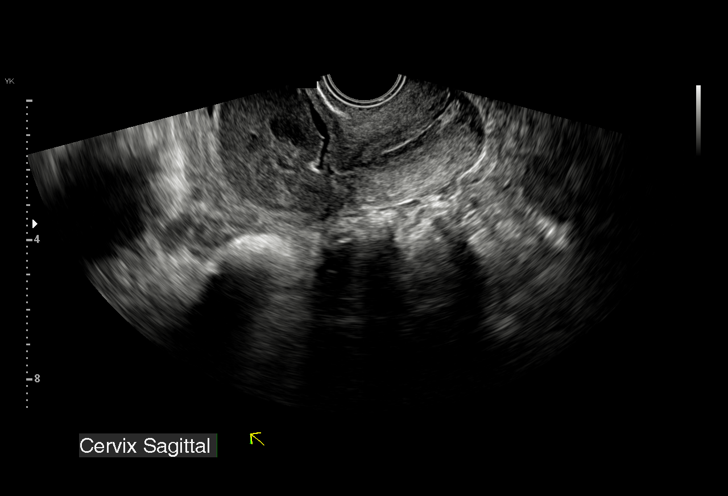
[im 97/97]
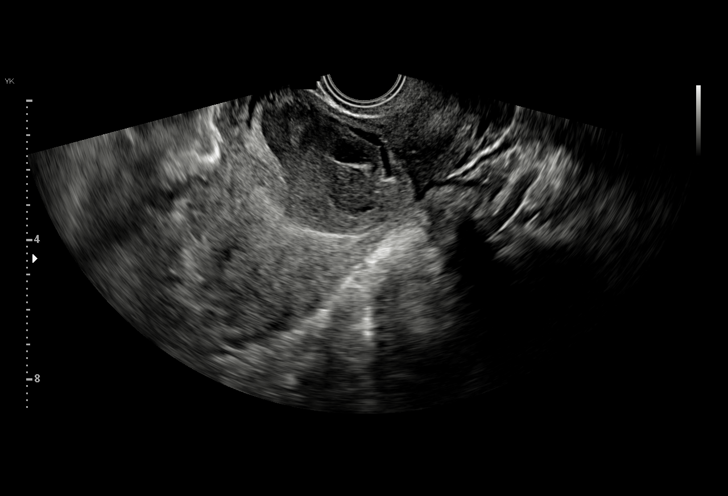

[15 of 28 positions shown; findings below may reference images not displayed]

FINDINGS: There is a heterogeneous masslike collection in the lower uterine
segment which demonstrates mixed echogenicity. There appears to be a
thin rim of surrounding fluid with a central large region which is
largely hypoechoic. Hyperechoic central regions are identified as
well. This finding is just above the cervix. No normal IUP is
identified. No fluid outside of the uterus within the pelvis. The
ovaries are unremarkable in appearance.
IMPRESSION: A normal IUP is not identified. There is a 5.4 x 4.6 x 4.4 cm
heterogeneous masslike collection in the lower uterine segment just
above the cervix. While nonspecific, I suspect this represents a
spontaneous abortion in process given history. However, the
ultrasound findings are unusual and clinical correlation is
recommended. Also, recommend a short-term follow-up ultrasound. If
this is truly a spontaneous abortion in process, the uterus should
return to normal appearance and the masslike collection should
resolve.

## 2020-11-01 ENCOUNTER — Other Ambulatory Visit (HOSPITAL_COMMUNITY)
Admission: RE | Admit: 2020-11-01 | Discharge: 2020-11-01 | Disposition: A | Discharge status: Home / Self Care | Payer: Medicaid Other | Source: Ambulatory Visit | Attending: Obstetrics & Gynecology | Admitting: Obstetrics & Gynecology

## 2020-11-01 ENCOUNTER — Other Ambulatory Visit: Payer: Self-pay

## 2020-11-01 ENCOUNTER — Ambulatory Visit
Admission: RE | Admit: 2020-11-01 | Discharge: 2020-11-01 | Disposition: A | Discharge status: Home / Self Care | Payer: Medicaid Other | Source: Ambulatory Visit | Attending: Obstetrics and Gynecology | Admitting: Obstetrics and Gynecology

## 2020-11-01 ENCOUNTER — Ambulatory Visit (INDEPENDENT_AMBULATORY_CARE_PROVIDER_SITE_OTHER): Payer: Medicaid Other | Admitting: Obstetrics and Gynecology

## 2020-11-01 ENCOUNTER — Encounter: Payer: Self-pay | Admitting: Obstetrics and Gynecology

## 2020-11-01 VITALS — Wt 173.9 lb

## 2020-11-01 DIAGNOSIS — Z124 Encounter for screening for malignant neoplasm of cervix: Secondary | ICD-10-CM | POA: Diagnosis present

## 2020-11-01 DIAGNOSIS — Z1231 Encounter for screening mammogram for malignant neoplasm of breast: Secondary | ICD-10-CM | POA: Diagnosis not present

## 2020-11-01 DIAGNOSIS — N926 Irregular menstruation, unspecified: Secondary | ICD-10-CM | POA: Insufficient documentation

## 2020-11-01 DIAGNOSIS — Z789 Other specified health status: Secondary | ICD-10-CM

## 2020-11-01 DIAGNOSIS — Z01419 Encounter for gynecological examination (general) (routine) without abnormal findings: Secondary | ICD-10-CM | POA: Insufficient documentation

## 2020-11-01 DIAGNOSIS — Z0000 Encounter for general adult medical examination without abnormal findings: Secondary | ICD-10-CM | POA: Diagnosis not present

## 2020-11-01 NOTE — Progress Notes (Signed)
GYNECOLOGY ANNUAL PREVENTATIVE CARE ENCOUNTER NOTE  History:     Ann Barr is a 40 y.o. [phone] female here for a routine annual gynecologic exam.  Current complaints: slightly late menses.   Denies abnormal vaginal bleeding, discharge, pelvic pain, problems with intercourse or other gynecologic concerns.    Gynecologic History Patient's last menstrual period was 10/11/2020 (exact date). Contraception: none Last Pap: 2018. Results were: normal with negative HPV Last mammogram: 9 years ago. Results were: normal  Obstetric History OB History  Gravida Para Term Preterm AB Living  6 5 4 1 1 4   SAB IAB Ectopic Multiple Live Births  1            # Outcome Date GA Lbr Len/2nd Weight Sex Delivery Anes PTL Lv  6 Preterm 03/23/14     CS-LTranv   FD  5 SAB           4 Term      Vag-Spont     3 Term      Vag-Spont     2 Term      Vag-Spont     1 Term      Vag-Spont       Past Medical History:  Diagnosis Date  . Hx of dilation and curettage 2015  . Medical history non-contributory     Past Surgical History:  Procedure Laterality Date  . CESAREAN SECTION  2015   Fetal demise with what sounds like abruptio placenta  . DILATION AND CURETTAGE OF UTERUS  2015    No current outpatient medications on file prior to visit.   No current facility-administered medications on file prior to visit.    No Known Allergies  Social History:  reports that she has never smoked. She has never used smokeless tobacco. She reports that she does not drink alcohol and does not use drugs.  History reviewed. No pertinent family history.  The following portions of the patient's history were reviewed and updated as appropriate: allergies, current medications, past family history, past medical history, past social history, past surgical history and problem list.  Review of Systems Pertinent items noted in HPI and remainder of comprehensive ROS otherwise negative.  Physical Exam:  Wt 173 lb 14.4 oz  (78.9 kg)   LMP 10/11/2020 (Exact Date)   BMI 28.07 kg/m  CONSTITUTIONAL: Well-developed, well-nourished female in no acute distress.  HENT:  Normocephalic, atraumatic, External right and left ear normal. Oropharynx is clear and moist EYES: Conjunctivae and EOM are normal.  NECK: Normal range of motion, supple, no masses.  Normal thyroid.  SKIN: Skin is warm and dry. No rash noted. Not diaphoretic. No erythema. No pallor. MUSCULOSKELETAL: Normal range of motion. No tenderness.  No cyanosis, clubbing, or edema.  2+ distal pulses. NEUROLOGIC: Alert and oriented to person, place, and time. Normal reflexes, muscle tone coordination.  PSYCHIATRIC: Normal mood and affect. Normal behavior. Normal judgment and thought content. CARDIOVASCULAR: Normal heart rate noted, regular rhythm RESPIRATORY: Clear to auscultation bilaterally. Effort and breath sounds normal, no problems with respiration noted. BREASTS: Symmetric in size. No masses, tenderness, skin changes, nipple drainage, or lymphadenopathy bilaterally. Performed in the presence of a chaperone. ABDOMEN: Soft, no distention noted.  No tenderness, rebound or guarding. Well healed pfannensteil incision noted PELVIC: Normal appearing external genitalia and urethral meatus; normal appearing vaginal mucosa and cervix.  No abnormal discharge noted.  Pap smear obtained.  Normal uterine size, no other palpable masses, no uterine or adnexal tenderness.  Performed  in the presence of a chaperone.   Assessment and Plan:    1. Visit for screening mammogram  - MM 3D SCREEN BREAST BILATERAL; Future  2. Pap smear for cervical cancer screening  - Cytology - PAP( [place])  3. Women's annual routine gynecological examination Discussed recent miscarriage and how this is unfortunately not uncommon with increasing age. Will check gyn u/s due to more irregular menses  Will follow up results of pap smear and manage accordingly. Mammogram scheduled Routine  preventative health maintenance measures emphasized. Please refer to After Visit Summary for other counseling recommendations.     F/u in 6 weeks in person  Mariel Aloe, MD, FACOG Obstetrician & Gynecologist, [facility] for [employer], [facility]

## 2020-11-01 NOTE — Progress Notes (Signed)
Language Resources [place] R.  Scheduled mammogram with mobile mammogram for 11/01/20 @ 0950.   U/S scheduled for February 4th @ 0800.  Pt notified  Addison Naegeli, RN  11/01/20

## 2020-11-05 LAB — CYTOLOGY - PAP
Comment: NEGATIVE
Diagnosis: NEGATIVE
High risk HPV: NEGATIVE

## 2020-11-09 ENCOUNTER — Other Ambulatory Visit: Payer: Self-pay

## 2020-11-09 ENCOUNTER — Ambulatory Visit
Admission: RE | Admit: 2020-11-09 | Discharge: 2020-11-09 | Disposition: A | Discharge status: Home / Self Care | Payer: Medicaid Other | Source: Ambulatory Visit | Attending: Obstetrics and Gynecology | Admitting: Obstetrics and Gynecology

## 2020-11-09 DIAGNOSIS — N926 Irregular menstruation, unspecified: Secondary | ICD-10-CM | POA: Diagnosis present

## 2020-12-17 ENCOUNTER — Ambulatory Visit: Payer: Medicaid Other | Admitting: Obstetrics and Gynecology

## 2020-12-17 ENCOUNTER — Other Ambulatory Visit: Payer: Self-pay

## 2021-01-17 ENCOUNTER — Encounter: Payer: Self-pay | Admitting: Family Medicine

## 2021-01-17 ENCOUNTER — Ambulatory Visit (INDEPENDENT_AMBULATORY_CARE_PROVIDER_SITE_OTHER): Payer: Medicaid Other | Admitting: Family Medicine

## 2021-01-17 VITALS — BP 141/85 | HR 63 | Ht 63.78 in | Wt 173.3 lb

## 2021-01-17 DIAGNOSIS — N926 Irregular menstruation, unspecified: Secondary | ICD-10-CM

## 2021-01-17 DIAGNOSIS — Z789 Other specified health status: Secondary | ICD-10-CM

## 2021-01-17 MED ORDER — CONCEPT OB 130-92.4-1 MG PO CAPS: 1.0000 | ORAL_CAPSULE | Freq: Every day | ORAL | 2 refills | Status: AC

## 2021-01-17 MED ORDER — MEDROXYPROGESTERONE ACETATE 10 MG PO TABS: 10.0000 mg | ORAL_TABLET | Freq: Every day | ORAL | 3 refills | Status: AC

## 2021-01-17 NOTE — Assessment & Plan Note (Signed)
[place] interpreter: in person used

## 2021-01-17 NOTE — Patient Instructions (Signed)

## 2021-01-17 NOTE — Assessment & Plan Note (Signed)
Possible adenomyosis based on u/s appearance. Discussed treatment options. Given desire for pregnancy, will use cyclical progestin q 4 weeks. Will likely help with pain as well. Check hormones.

## 2021-01-17 NOTE — Progress Notes (Signed)
   Subjective:    Patient ID: Ann Barr is a 40 y.o. female presenting with Follow-up and Results  on 01/17/2021  HPI: Here for f/u from irregular cycles and notes that her cycles are late and lasting longer than usual. Lasting 8-10 days. Cycles are usually are 2 wks late. Her u/s appeared normal, with normal stripe at 7 mm, but some fluid at site of old C-section scar. Desires more pregnancies. Also with pain prior to starting her cycle.  Review of Systems  Constitutional: Negative for chills and fever.  Respiratory: Negative for shortness of breath.   Cardiovascular: Negative for chest pain.  Gastrointestinal: Negative for abdominal pain, nausea and vomiting.  Genitourinary: Negative for dysuria.  Skin: Negative for rash.      Objective:    BP (!) 141/85   Pulse 63   Ht 5' 3.78" (1.62 m)   Wt 173 lb 4.8 oz (78.6 kg)   LMP 01/07/2021 (Exact Date)   BMI 29.95 kg/m  Physical Exam Constitutional:      General: She is not in acute distress.    Appearance: She is well-developed.  HENT:     Head: Normocephalic and atraumatic.  Eyes:     General: No scleral icterus. Cardiovascular:     Rate and Rhythm: Normal rate.  Pulmonary:     Effort: Pulmonary effort is normal.  Abdominal:     Palpations: Abdomen is soft.  Musculoskeletal:     Cervical back: Neck supple.  Skin:    General: Skin is warm and dry.  Neurological:     Mental Status: She is alert and oriented to person, place, and time.    Pelvic sono form 11/09/2020 IMPRESSION: 1. Transvaginal only exam ordered. This limits assessment of the adnexal regions due to the limited depth of view on endovaginal scanning. 2. Assessment of uterus further limited due to lack of uterine flexion and high posterior position of the fundal region. Endometrial stripe thickness in the fundal region (no fluid in the canal at this level) is 7 mm. If bleeding remains unresponsive to hormonal or medical therapy, sonohysterogram should be  considered for focal lesion work-up. (Ref: Radiological Reasoning: Algorithmic Workup of Abnormal Vaginal Bleeding with Endovaginal Sonography and Sonohysterography. AJR 2008; 001:V49-44) 3. Small volume endometrial fluid noted lower uterine segment in the region of the C-section scar, indeterminate.     Assessment & Plan:   Problem List Items Addressed This Visit      Unprioritized   Irregular menses - Primary    Possible adenomyosis based on u/s appearance. Discussed treatment options. Given desire for pregnancy, will use cyclical progestin q 4 weeks. Will likely help with pain as well. Check hormones.      Relevant Medications   medroxyPROGESTERone (PROVERA) 10 MG tablet   Prenat w/o A Vit-FeFum-FePo-FA (CONCEPT OB) 130-92.4-1 MG CAPS   Other Relevant Orders   CBC   TSH   Follicle stimulating hormone   Language barrier    [place] interpreter: in person used          Return in about 3 months (around 04/18/2021) for a follow-up.  Reva Bores 01/17/2021 9:58 AM

## 2021-01-17 NOTE — Progress Notes (Signed)
Ann Barr- In person [place] Interpreter.

## 2021-01-18 LAB — CBC
Hematocrit: 36.2 % (ref 34.0–46.6)
Hemoglobin: 11.7 g/dL (ref 11.1–15.9)
MCH: 27.8 pg (ref 26.6–33.0)
MCHC: 32.3 g/dL (ref 31.5–35.7)
MCV: 86 fL (ref 79–97)
Platelets: 243 10*3/uL (ref 150–450)
RBC: 4.21 x10E6/uL (ref 3.77–5.28)
RDW: 12.2 % (ref 11.7–15.4)
WBC: 5.9 10*3/uL (ref 3.4–10.8)

## 2021-01-18 LAB — FOLLICLE STIMULATING HORMONE: FSH: 48.1 m[IU]/mL

## 2021-01-18 LAB — TSH: TSH: 0.803 u[IU]/mL (ref 0.450–4.500)

## 2021-08-02 DIAGNOSIS — E0590 Thyrotoxicosis, unspecified without thyrotoxic crisis or storm: Secondary | ICD-10-CM | POA: Diagnosis not present

## 2021-08-02 DIAGNOSIS — I10 Essential (primary) hypertension: Secondary | ICD-10-CM | POA: Diagnosis not present

## 2021-08-02 DIAGNOSIS — R002 Palpitations: Secondary | ICD-10-CM | POA: Diagnosis not present

## 2021-08-02 DIAGNOSIS — R531 Weakness: Secondary | ICD-10-CM | POA: Diagnosis not present

## 2021-08-05 ENCOUNTER — Other Ambulatory Visit: Payer: Self-pay | Admitting: Family Medicine

## 2021-08-05 ENCOUNTER — Other Ambulatory Visit (HOSPITAL_COMMUNITY): Payer: Self-pay | Admitting: Family Medicine

## 2021-08-05 DIAGNOSIS — E0590 Thyrotoxicosis, unspecified without thyrotoxic crisis or storm: Secondary | ICD-10-CM

## 2021-08-08 DIAGNOSIS — I10 Essential (primary) hypertension: Secondary | ICD-10-CM | POA: Diagnosis not present

## 2021-08-08 DIAGNOSIS — N926 Irregular menstruation, unspecified: Secondary | ICD-10-CM | POA: Diagnosis not present

## 2021-08-08 DIAGNOSIS — E0590 Thyrotoxicosis, unspecified without thyrotoxic crisis or storm: Secondary | ICD-10-CM | POA: Diagnosis not present

## 2021-08-22 DIAGNOSIS — E0590 Thyrotoxicosis, unspecified without thyrotoxic crisis or storm: Secondary | ICD-10-CM | POA: Diagnosis not present

## 2021-08-22 DIAGNOSIS — I10 Essential (primary) hypertension: Secondary | ICD-10-CM | POA: Diagnosis not present

## 2021-08-22 DIAGNOSIS — E559 Vitamin D deficiency, unspecified: Secondary | ICD-10-CM | POA: Diagnosis not present

## 2021-09-09 DIAGNOSIS — R002 Palpitations: Secondary | ICD-10-CM | POA: Diagnosis not present

## 2021-09-09 DIAGNOSIS — R519 Headache, unspecified: Secondary | ICD-10-CM | POA: Diagnosis not present

## 2021-09-09 DIAGNOSIS — E0590 Thyrotoxicosis, unspecified without thyrotoxic crisis or storm: Secondary | ICD-10-CM | POA: Diagnosis not present

## 2021-09-09 DIAGNOSIS — I10 Essential (primary) hypertension: Secondary | ICD-10-CM | POA: Diagnosis not present

## 2021-10-17 DIAGNOSIS — E0590 Thyrotoxicosis, unspecified without thyrotoxic crisis or storm: Secondary | ICD-10-CM | POA: Diagnosis not present

## 2021-11-14 ENCOUNTER — Ambulatory Visit (INDEPENDENT_AMBULATORY_CARE_PROVIDER_SITE_OTHER): Payer: BC Managed Care – PPO | Admitting: Student

## 2021-11-14 ENCOUNTER — Encounter: Payer: Self-pay | Admitting: Student

## 2021-11-14 ENCOUNTER — Other Ambulatory Visit: Payer: Self-pay

## 2021-11-14 VITALS — BP 126/61 | HR 66 | Wt 179.8 lb

## 2021-11-14 DIAGNOSIS — N926 Irregular menstruation, unspecified: Secondary | ICD-10-CM

## 2021-11-14 MED ORDER — MEDROXYPROGESTERONE ACETATE 10 MG PO TABS: 10.0000 mg | ORAL_TABLET | Freq: Every day | ORAL | 0 refills | Status: DC

## 2021-11-14 MED ORDER — MEDROXYPROGESTERONE ACETATE 10 MG PO TABS: 10.0000 mg | ORAL_TABLET | Freq: Every day | ORAL | 0 refills | Status: AC

## 2021-11-14 NOTE — Progress Notes (Signed)
°  History:  [name] is a 41 y.o. [id number] who presents to clinic today for follow up for GYN visit. She reports that she stopped her period in October and then it returned in December 23. When it came on December 23 she reports it has come every other week. She reports that it lasts for 8 days. It is like a regular period; she reports having period symptoms but they are not debilitating. . She reports that sometimes the flow is dark and sometimes has clots. She denies any other gyn complaints. She does not wish to become pregnant.    Patient had gyn appt in April for this complaint and was put on Provera. She states that it was working, but then she stopped taking it in October. She does not want to go on birth control. She would like to restart Provera.  The following portions of the patient's history were reviewed and updated as appropriate: allergies, current medications, family history, past medical history, social history, past surgical history and problem list.  Review of Systems:  Review of Systems  Constitutional: Negative.   HENT: Negative.    Respiratory: Negative.    Cardiovascular: Negative.   Genitourinary: Negative.   Musculoskeletal: Negative.   Skin: Negative.   Neurological: Negative.   Psychiatric/Behavioral: Negative.       Objective:  Physical Exam BP 126/61    Pulse 66    Wt 179 lb 12.8 oz (81.6 kg)    BMI 31.08 kg/m  Physical Exam Constitutional:      Appearance: Normal appearance.  Cardiovascular:     Rate and Rhythm: Normal rate.  Musculoskeletal:        General: Normal range of motion.  Skin:    General: Skin is warm.  Neurological:     Mental Status: She is alert.  Psychiatric:        Mood and Affect: Mood normal.      Labs and Imaging No results found for this or any previous visit (from the past 24 hour(s)).  No results found.  Health Maintenance Due  Topic Date Due   Hepatitis C Screening  Never done   TETANUS/TDAP  Never done    INFLUENZA VACCINE  Never done     Assessment & Plan:   1. Abnormal menses    -patient not due for pap (last pap was normal 12 months ago) -patient will be contacted for screening mammogram appt -she declines birth control -discussed with Dr. Roselie Awkward; who  reviewed [place] results from last April. He does not recommend any repeat US as endometrial lining was 7 mm. Suggest following regimen: start provera now until end of February, then stop provera until April. On April 1, start provera for 10 days. Start again on May 1st. Patient likes this method because it follows calendar and is easy to remember.  -follow up with MD in 4 months.   Approximately 30 minutes of total time was spent with this patient on care and coordination.   [place], [place] 11/14/2021 4:28 PM

## 2021-11-19 ENCOUNTER — Telehealth: Payer: Self-pay | Admitting: *Deleted

## 2021-11-19 DIAGNOSIS — Z1231 Encounter for screening mammogram for malignant neoplasm of breast: Secondary | ICD-10-CM

## 2021-11-19 NOTE — Telephone Encounter (Signed)
Received message from Luna Kitchens, CNM to schedule screening mammogram and notify patient. Called and scheduled for 12/05/21 at 0840, arrive at 0820. I called Elika with [employer] # [phone] and notified her of appointment and to wear 2 piece outfit , no powder, no deodorant, no lotions. She voices understanding. Levonne Carreras,RN

## 2021-12-05 ENCOUNTER — Ambulatory Visit
Admission: RE | Admit: 2021-12-05 | Discharge: 2021-12-05 | Disposition: A | Discharge status: Home / Self Care | Payer: BC Managed Care – PPO | Source: Ambulatory Visit | Attending: Student | Admitting: Student

## 2021-12-05 DIAGNOSIS — Z1231 Encounter for screening mammogram for malignant neoplasm of breast: Secondary | ICD-10-CM

## 2021-12-12 DIAGNOSIS — I10 Essential (primary) hypertension: Secondary | ICD-10-CM | POA: Diagnosis not present

## 2021-12-12 DIAGNOSIS — E559 Vitamin D deficiency, unspecified: Secondary | ICD-10-CM | POA: Diagnosis not present

## 2021-12-12 DIAGNOSIS — E0590 Thyrotoxicosis, unspecified without thyrotoxic crisis or storm: Secondary | ICD-10-CM | POA: Diagnosis not present

## 2021-12-31 ENCOUNTER — Other Ambulatory Visit: Payer: Self-pay | Admitting: Student

## 2021-12-31 MED ORDER — MEDROXYPROGESTERONE ACETATE 10 MG PO TABS
10.0000 mg | ORAL_TABLET | Freq: Every day | ORAL | 0 refills | Status: AC
Start: 2021-12-31 — End: ?

## 2022-01-30 DIAGNOSIS — E559 Vitamin D deficiency, unspecified: Secondary | ICD-10-CM | POA: Diagnosis not present

## 2022-01-30 DIAGNOSIS — I10 Essential (primary) hypertension: Secondary | ICD-10-CM | POA: Diagnosis not present

## 2022-01-30 DIAGNOSIS — E0500 Thyrotoxicosis with diffuse goiter without thyrotoxic crisis or storm: Secondary | ICD-10-CM | POA: Diagnosis not present

## 2022-02-06 DIAGNOSIS — R519 Headache, unspecified: Secondary | ICD-10-CM | POA: Diagnosis not present

## 2022-02-06 DIAGNOSIS — I10 Essential (primary) hypertension: Secondary | ICD-10-CM | POA: Diagnosis not present

## 2022-05-13 DIAGNOSIS — E0590 Thyrotoxicosis, unspecified without thyrotoxic crisis or storm: Secondary | ICD-10-CM | POA: Diagnosis not present

## 2022-08-12 ENCOUNTER — Ambulatory Visit
Admission: RE | Admit: 2022-08-12 | Discharge: 2022-08-12 | Disposition: A | Discharge status: Home / Self Care | Payer: BC Managed Care – PPO | Source: Ambulatory Visit | Attending: Internal Medicine | Admitting: Internal Medicine

## 2022-08-12 ENCOUNTER — Other Ambulatory Visit: Payer: Self-pay | Admitting: Internal Medicine

## 2022-08-12 DIAGNOSIS — E0590 Thyrotoxicosis, unspecified without thyrotoxic crisis or storm: Secondary | ICD-10-CM | POA: Diagnosis not present

## 2022-08-12 DIAGNOSIS — M545 Low back pain, unspecified: Secondary | ICD-10-CM

## 2022-08-12 DIAGNOSIS — R002 Palpitations: Secondary | ICD-10-CM | POA: Diagnosis not present

## 2022-08-12 DIAGNOSIS — B353 Tinea pedis: Secondary | ICD-10-CM | POA: Diagnosis not present

## 2022-08-12 DIAGNOSIS — D649 Anemia, unspecified: Secondary | ICD-10-CM | POA: Diagnosis not present

## 2022-08-27 ENCOUNTER — Ambulatory Visit (INDEPENDENT_AMBULATORY_CARE_PROVIDER_SITE_OTHER): Payer: BC Managed Care – PPO | Admitting: Obstetrics and Gynecology

## 2022-08-27 ENCOUNTER — Encounter: Payer: Self-pay | Admitting: Obstetrics and Gynecology

## 2022-08-27 VITALS — BP 137/89 | HR 58

## 2022-08-27 DIAGNOSIS — N939 Abnormal uterine and vaginal bleeding, unspecified: Secondary | ICD-10-CM

## 2022-08-27 NOTE — Progress Notes (Signed)
NEW GYNECOLOGY PATIENT Patient name: Ann Barr MRN [id number]  Date of birth: 11-15-1980 Chief Complaint:   Menstrual Problem     History:  Ann Barr is a 41 y.o. [id number] being seen today for menstrual concerns.    Presents with concerns regarding irregular menses and back pain. Previously prescribed provera to be taken cyclically. Stopped taking the provera about 3 months ago due to continued bleeding. Last menses was Oct 16. First time she tooke the med, it helped but then it became irregular again. She does not want anything that would prevent her from becoming pregnant and would happily welcome a pregnancy.  In the last 3 months it has remained irregular and heavy. She has lower abdominal pain during menses as well as back pain, no vaginal pain. Occasional dark clots with bleeding. State in 2020 had heavy bleeding, [place] that showed thickened and they could do a D&C - deferred until return to [place] and told D&C not needed.  Notes decreased sexual desires x 20 years and noticed after 2nd baby.   2 weeks ago went to [facility] internal medicine to see endocrinology. Currently taking methimazole. States they also checkd her sugar and she does not have diabetes.        Review of Systems  All other systems reviewed and are negative.       Gynecologic History No LMP recorded. Contraception: none Last Pap: 10/2020. Result was normal with negative HPV Last Mammogram: 12/2021.  Result was normal Last Colonoscopy: n/a  Obstetric History OB History  Gravida Para Term Preterm AB Living  6 5 4 1 1 4   SAB IAB Ectopic Multiple Live Births  1 0 0 0 4    # Outcome Date GA Lbr Len/2nd Weight Sex Delivery Anes PTL Lv  6 Preterm 03/23/14     CS-LTranv   FD  5 SAB           4 Term      Vag-Spont     3 Term      Vag-Spont     2 Term      Vag-Spont     1 Term      Vag-Spont       Past Medical History:  Diagnosis Date   Hx of dilation and curettage  2015   Medical history non-contributory     Past Surgical History:  Procedure Laterality Date   CESAREAN SECTION  2015   Fetal demise with what sounds like abruptio placenta   DILATION AND CURETTAGE OF UTERUS  2015    Current Outpatient Medications on File Prior to Visit  Medication Sig Dispense Refill   carvedilol (COREG) 3.125 MG tablet Take by mouth.     methimazole (TAPAZOLE) 10 MG tablet Take by mouth.     Vitamin D, Ergocalciferol, (DRISDOL) 1.25 MG (50000 UNIT) CAPS capsule Take 50,000 Units by mouth once a week.     hydrOXYzine (ATARAX) 10 MG tablet Take 10 mg by mouth 3 (three) times daily as needed. (Patient not taking: Reported on 08/27/2022)     medroxyPROGESTERone (PROVERA) 10 MG tablet Take 1 tablet (10 mg total) by mouth daily. Take if > 4 weeks since last cycle x 5-10 days until her cycle starts (Patient not taking: Reported on 11/14/2021) 30 tablet 3   medroxyPROGESTERone (PROVERA) 10 MG tablet Take 1 tablet (10 mg total) by mouth daily. Starting on April 1, take for 10 days and then stop.  Then, beginning of May, take for 10 days and then stop. Beginning of June, start for 10 days and then stop. (Patient not taking: Reported on 08/27/2022) 10 tablet 0   medroxyPROGESTERone (PROVERA) 10 MG tablet Take 1 tablet (10 mg total) by mouth daily. (Patient not taking: Reported on 08/27/2022) 28 tablet 0   Prenat w/o A Vit-FeFum-FePo-FA (CONCEPT OB) 130-92.4-1 MG CAPS Take 1 capsule by mouth daily. (Patient not taking: Reported on 11/14/2021) 180 capsule 2   No current facility-administered medications on file prior to visit.    No Known Allergies  Social History:  reports that she has never smoked. She has never used smokeless tobacco. She reports that she does not drink alcohol and does not use drugs.  Family History  Problem Relation Age of Onset   Hypertension Mother     The following portions of the patient's history were reviewed and updated as appropriate: allergies,  current medications, past family history, past medical history, past social history, past surgical history and problem list.  Review of Systems Pertinent items noted in HPI and remainder of comprehensive ROS otherwise negative.  Physical Exam:  BP 137/89   Pulse (!) 58  Physical Exam Vitals and nursing note reviewed.  Constitutional:      Appearance: Normal appearance.  Cardiovascular:     Rate and Rhythm: Normal rate.  Pulmonary:     Effort: Pulmonary effort is normal.     Breath sounds: Normal breath sounds.  Abdominal:     Palpations: Abdomen is soft.     Comments: + carnett sign (? -tightened abdominal muscles instead of chin-to-test or leg raise)  Neurological:     General: No focal deficit present.     Mental Status: She is alert and oriented to person, place, and time.  Psychiatric:        Mood and Affect: Mood normal.        Behavior: Behavior normal.        Thought Content: Thought content normal.        Judgment: Judgment normal.        Assessment and Plan:   1. Abnormal uterine bleeding (AUB) Possible cause of bleeding is thyroid dysfunction. Collect remaining AUB workup labs. Recommend pelvic exam to assess pelvic floor, repeat abdominal exam to assess for Carnett sign and EMB. Reviewed that treatment options are limited due to her not wanting to prevent pregnancy.  - CBC - Follicle stimulating hormone - Prolactin  Routine preventative health maintenance measures emphasized. Please refer to After Visit Summary for other counseling recommendations.      Darliss Cheney, MD Obstetrician & Gynecologist, [facility] for [employer], [place]

## 2022-08-27 NOTE — Progress Notes (Deleted)
Stopped taking the provera about 3 months ago due to continued bleeding. Lact menses was Oct 16. First time wshe tooke the med, it helped but then it   Does not want anything that would prevent her rom being pregnant. In the last 3 months it has remained   Decreased sexual desires x 20 years. Occasional dark clots Had a 2020   2 weeks ago  [facility] - went 2 weeks ago . Currently taking methimzaoole  Cramps sometimes with menses ?carnett Lower abdomienal pain with mesnes

## 2022-08-28 LAB — CBC
Hematocrit: 36.9 % (ref 34.0–46.6)
Hemoglobin: 11.8 g/dL (ref 11.1–15.9)
MCH: 27.6 pg (ref 26.6–33.0)
MCHC: 32 g/dL (ref 31.5–35.7)
MCV: 86 fL (ref 79–97)
Platelets: 294 10*3/uL (ref 150–450)
RBC: 4.27 x10E6/uL (ref 3.77–5.28)
RDW: 12.4 % (ref 11.7–15.4)
WBC: 7.4 10*3/uL (ref 3.4–10.8)

## 2022-08-28 LAB — FOLLICLE STIMULATING HORMONE: FSH: 58.3 m[IU]/mL

## 2022-08-28 LAB — PROLACTIN: Prolactin: 9.6 ng/mL (ref 4.8–23.3)

## 2022-09-01 ENCOUNTER — Other Ambulatory Visit: Payer: Self-pay | Admitting: Obstetrics and Gynecology

## 2022-09-01 DIAGNOSIS — N926 Irregular menstruation, unspecified: Secondary | ICD-10-CM

## 2022-10-02 ENCOUNTER — Other Ambulatory Visit: Payer: Self-pay

## 2022-10-02 ENCOUNTER — Other Ambulatory Visit (HOSPITAL_COMMUNITY)
Admission: RE | Admit: 2022-10-02 | Discharge: 2022-10-02 | Disposition: A | Discharge status: Home / Self Care | Payer: BC Managed Care – PPO | Source: Ambulatory Visit | Attending: Obstetrics and Gynecology | Admitting: Obstetrics and Gynecology

## 2022-10-02 ENCOUNTER — Ambulatory Visit (INDEPENDENT_AMBULATORY_CARE_PROVIDER_SITE_OTHER): Payer: BC Managed Care – PPO | Admitting: Obstetrics and Gynecology

## 2022-10-02 VITALS — BP 130/88 | HR 69

## 2022-10-02 DIAGNOSIS — Z1330 Encounter for screening examination for mental health and behavioral disorders, unspecified: Secondary | ICD-10-CM | POA: Diagnosis not present

## 2022-10-02 DIAGNOSIS — N939 Abnormal uterine and vaginal bleeding, unspecified: Secondary | ICD-10-CM | POA: Diagnosis present

## 2022-10-02 DIAGNOSIS — N926 Irregular menstruation, unspecified: Secondary | ICD-10-CM | POA: Insufficient documentation

## 2022-10-02 LAB — POCT PREGNANCY, URINE: Preg Test, Ur: NEGATIVE

## 2022-10-02 NOTE — Progress Notes (Signed)
    GYNECOLOGY VISIT  Patient name: Ann Barr MRN [id number]  Date of birth: 02/11/1981 Chief Complaint:   Procedure  History:  Ann Barr is a 41 y.o. [id number] being seen today for EMB for irregular bleeding. Last menses Oct 19. No endocrine visit since last GYN visit but still taking methimazole. Still interested in pregnancy.   The following portions of the patient's history were reviewed and updated as appropriate: allergies, current medications, past family history, past medical history, past social history, past surgical history and problem list.    Review of Systems:  Pertinent items are noted in HPI. Comprehensive review of systems was otherwise negative.   Objective:  Physical Exam BP 130/88   Pulse 69   LMP 07/21/2022    Physical Exam Vitals and nursing note reviewed.  Constitutional:      Appearance: Normal appearance.  HENT:     Head: Normocephalic and atraumatic.  Pulmonary:     Effort: Pulmonary effort is normal.  Skin:    General: Skin is warm and dry.  Neurological:     General: No focal deficit present.     Mental Status: She is alert.  Psychiatric:        Mood and Affect: Mood normal.        Behavior: Behavior normal.        Thought Content: Thought content normal.        Judgment: Judgment normal.    Endometrial Biopsy Procedure  Patient identified, informed consent performed,  indication reviewed, consent signed.  Reviewed risk of perforation, pain, bleeding, insufficient sample, etc were reviewd. Time out was performed.  Urine pregnancy test negative.  Speculum placed in the vagina.  Cervix visualized.  Cleaned with Betadine x 2.  Paracervical block was administered and the endocervical canal instilled using 1% lidocaine.  Endometrial pipelle was used to draw up 1cc of 1% lidocaine, introduced into the cervical os and instilled into the endometrial cavity. The pipelle was passed three times without difficulty and sample obtained. All instruments were  removed, good hemostasis noted.  Patient tolerated procedure well.  Patient was given post-procedure instructions.      Assessment & Plan:  1. Abnormal uterine bleeding (AUB) - Surgical pathology  2. Irregular menses Instructed to repeat labs day 3 though elevated FSH and irregular menses highly suspicious for menopause. AMH added to future orders. Will follow up surgical pathology results. Reviewed that perimenopause means likelihood of spontaneous pregnancy very low.  - Surgical pathology   Routine preventative health maintenance measures emphasized.  Lorriane Shire, MD [facility] for [facility], [facility]

## 2022-10-07 LAB — SURGICAL PATHOLOGY

## 2022-11-13 DIAGNOSIS — R002 Palpitations: Secondary | ICD-10-CM | POA: Diagnosis not present

## 2022-11-13 DIAGNOSIS — E0590 Thyrotoxicosis, unspecified without thyrotoxic crisis or storm: Secondary | ICD-10-CM | POA: Diagnosis not present

## 2022-11-13 DIAGNOSIS — D649 Anemia, unspecified: Secondary | ICD-10-CM | POA: Diagnosis not present

## 2022-11-13 DIAGNOSIS — B353 Tinea pedis: Secondary | ICD-10-CM | POA: Diagnosis not present

## 2022-11-14 ENCOUNTER — Other Ambulatory Visit (HOSPITAL_COMMUNITY): Payer: Self-pay | Admitting: Internal Medicine

## 2022-11-14 DIAGNOSIS — E0590 Thyrotoxicosis, unspecified without thyrotoxic crisis or storm: Secondary | ICD-10-CM

## 2022-12-01 ENCOUNTER — Encounter (HOSPITAL_COMMUNITY)
Admission: RE | Admit: 2022-12-01 | Discharge: 2022-12-01 | Disposition: A | Discharge status: Home / Self Care | Payer: BC Managed Care – PPO | Source: Ambulatory Visit | Attending: Internal Medicine | Admitting: Internal Medicine

## 2022-12-01 DIAGNOSIS — E0590 Thyrotoxicosis, unspecified without thyrotoxic crisis or storm: Secondary | ICD-10-CM | POA: Insufficient documentation

## 2022-12-01 LAB — HCG, SERUM, QUALITATIVE: Preg, Serum: NEGATIVE

## 2022-12-01 MED ORDER — SODIUM IODIDE I-123 7.4 MBQ CAPS
433.0000 | ORAL_CAPSULE | Freq: Once | ORAL | Status: AC
  Administered 2022-12-01: 433 via ORAL

## 2022-12-02 ENCOUNTER — Encounter (HOSPITAL_COMMUNITY)
Admission: RE | Admit: 2022-12-02 | Discharge: 2022-12-02 | Disposition: A | Discharge status: Home / Self Care | Payer: BC Managed Care – PPO | Source: Ambulatory Visit | Attending: Internal Medicine | Admitting: Internal Medicine

## 2022-12-02 DIAGNOSIS — E0590 Thyrotoxicosis, unspecified without thyrotoxic crisis or storm: Secondary | ICD-10-CM | POA: Diagnosis not present

## 2022-12-08 ENCOUNTER — Ambulatory Visit: Payer: BC Managed Care – PPO | Admitting: Obstetrics and Gynecology

## 2023-01-07 ENCOUNTER — Ambulatory Visit (INDEPENDENT_AMBULATORY_CARE_PROVIDER_SITE_OTHER): Payer: BC Managed Care – PPO | Admitting: Obstetrics and Gynecology

## 2023-01-07 ENCOUNTER — Other Ambulatory Visit: Payer: Self-pay

## 2023-01-07 ENCOUNTER — Encounter: Payer: Self-pay | Admitting: Obstetrics and Gynecology

## 2023-01-07 VITALS — BP 145/93 | HR 58 | Wt 177.0 lb

## 2023-01-07 DIAGNOSIS — F520 Hypoactive sexual desire disorder: Secondary | ICD-10-CM | POA: Diagnosis not present

## 2023-01-07 DIAGNOSIS — N926 Irregular menstruation, unspecified: Secondary | ICD-10-CM | POA: Diagnosis not present

## 2023-01-07 NOTE — Progress Notes (Signed)
GYNECOLOGY VISIT  Patient name: Ann Barr MRN [id number]  Date of birth: [birth year] Chief Complaint:   Follow-up and Menstrual Problem  History:  Ann Barr is a 42 y.o. [id number] being seen today for AUB follow up. Every 3 months she will have bleed - menses started today and last mense was normal and was not painful.   No longer having cramping with hte pain  No hot flashes No vaginal dryness - had it a long time  Reports having had irregular menses like this when she was younger and could go 3 months without a cycle No desire for intercourse at since second baby  = complete lack of desire for intercourse and it is upsetting  Future pregnancy: has not taken anything to prevent pregnancy for the last 7 years; previously used contraception for birth spacing. Would really like to get pregnant again and would be willing to see a specialist to help her get pregnant   Past Medical History:  Diagnosis Date   Hx of dilation and curettage 2015   Medical history non-contributory     Past Surgical History:  Procedure Laterality Date   CESAREAN SECTION  2015   Fetal demise with what sounds like abruptio placenta   DILATION AND CURETTAGE OF UTERUS  2015    The following portions of the patient's history were reviewed and updated as appropriate: allergies, current medications, past family history, past medical history, past social history, past surgical history and problem list.   Health Maintenance:   Last pap     Component Value Date/Time   DIAGPAP  11/01/2020 0930    - Negative for intraepithelial lesion or malignancy (NILM)   [place] Negative 11/01/2020 0930   ADEQPAP  11/01/2020 0930    Satisfactory for evaluation; transformation zone component PRESENT.    Last mammogram: 12/2021 BIRADS 1   Review of Systems:  Pertinent items are noted in HPI. Comprehensive review of systems was otherwise negative.   Objective:  Physical Exam BP (!) 145/93   Pulse (!) 58   Wt 177 lb (80.3  kg)   LMP 01/07/2023 (Exact Date)   BMI 30.59 kg/m    Physical Exam Vitals and nursing note reviewed.  Constitutional:      Appearance: Normal appearance.  HENT:     Head: Normocephalic and atraumatic.  Pulmonary:     Effort: Pulmonary effort is normal.  Skin:    General: Skin is warm and dry.  Neurological:     General: No focal deficit present.     Mental Status: She is alert.  Psychiatric:        Mood and Affect: Mood normal.        Behavior: Behavior normal.        Thought Content: Thought content normal.        Judgment: Judgment normal.    EMB (09/2022) FINAL MICROSCOPIC DIAGNOSIS:   A. ENDOMETRIUM, BIOPSY:  - Benign endometrium and early secretory phase  - Negative for hyperplasia or malignancy      Assessment & Plan:   1. Irregular menses Concern for menopause given very elevated FSH - repeat FSH today with AMH and estradiol given desire to conceive. Discussed that if labs are consistent with menopause, would recommend REI follow up as she likely will not conceive spontaneously.  - Anti mullerian hormone - FSH - Estradiol  2. Decreased sexual desire Discussed that sexual desire often multifactorial. If labs consistent with menopause, may benefit from hormones vs other non-hormonal medications  given age and lack of other perimenopausal symptoms including addyi, busprione and buproprion.    Darliss Cheney, MD [facility] for [place]

## 2023-01-07 NOTE — Addendum Note (Signed)
Addended by: Cindi Carbon on: 01/07/2023 04:34 PM   Modules accepted: Orders

## 2023-01-11 LAB — ANTI MULLERIAN HORMONE: ANTI-MULLERIAN HORMONE (AMH): <0.015 ng/mL

## 2023-01-11 LAB — FOLLICLE STIMULATING HORMONE: FSH: 27.4 m[IU]/mL

## 2023-01-11 LAB — ESTRADIOL: Estradiol: 41.7 pg/mL

## 2023-01-12 ENCOUNTER — Other Ambulatory Visit: Payer: Self-pay | Admitting: Obstetrics and Gynecology

## 2023-01-12 DIAGNOSIS — N926 Irregular menstruation, unspecified: Secondary | ICD-10-CM

## 2023-01-12 DIAGNOSIS — F520 Hypoactive sexual desire disorder: Secondary | ICD-10-CM

## 2023-01-12 MED ORDER — BUPROPION HCL ER (SR) 150 MG PO TB12: 150.0000 mg | ORAL_TABLET | Freq: Every day | ORAL | 3 refills | Status: DC

## 2023-01-13 ENCOUNTER — Encounter: Payer: Self-pay | Admitting: Obstetrics and Gynecology

## 2023-02-13 DIAGNOSIS — D649 Anemia, unspecified: Secondary | ICD-10-CM | POA: Diagnosis not present

## 2023-02-13 DIAGNOSIS — Z8639 Personal history of other endocrine, nutritional and metabolic disease: Secondary | ICD-10-CM | POA: Diagnosis not present

## 2023-02-18 ENCOUNTER — Telehealth: Payer: BC Managed Care – PPO | Admitting: Obstetrics and Gynecology

## 2023-02-24 ENCOUNTER — Encounter: Payer: Self-pay | Admitting: Obstetrics and Gynecology

## 2023-02-24 ENCOUNTER — Telehealth (INDEPENDENT_AMBULATORY_CARE_PROVIDER_SITE_OTHER): Payer: BC Managed Care – PPO | Admitting: Obstetrics and Gynecology

## 2023-02-24 DIAGNOSIS — F520 Hypoactive sexual desire disorder: Secondary | ICD-10-CM | POA: Diagnosis not present

## 2023-02-24 DIAGNOSIS — N979 Female infertility, unspecified: Secondary | ICD-10-CM

## 2023-02-24 DIAGNOSIS — N926 Irregular menstruation, unspecified: Secondary | ICD-10-CM

## 2023-02-24 MED ORDER — BUPROPION HCL ER (SR) 150 MG PO TB12: 150.0000 mg | ORAL_TABLET | Freq: Two times a day (BID) | ORAL | 3 refills | Status: AC

## 2023-02-24 NOTE — Progress Notes (Signed)
GYNECOLOGY VIRTUAL VISIT ENCOUNTER NOTE  Provider location: [facility]   Patient location: Home  I connected with Ann Barr on 02/24/23 at  2:35 PM EDT by MyChart Video Encounter and verified that I am speaking with the correct person using two identifiers.   I discussed the limitations, risks, security and privacy concerns of performing an evaluation and management service virtually and the availability of in person appointments. I also discussed with the patient that there may be a patient responsible charge related to this service. The patient expressed understanding and agreed to proceed.   History:  Ann Barr is a 42 y.o. [phone] female being evaluated today for follow up of fertility concerns, abnormal uterine bleeding/oligomenorrhea, and decreased sexual desire.   Menses are normal at this time, has had normal menses for the last 2 months.   Started taking bupropion - no increase in desire but has not had any adverse side effects. Denies anxiety and insomnia.   Has heard from infertility clinic but she noted that she was considering travel in June. Has not fully decided that she is going to travel but wants to scheduled fertility visit for after.   E2 41.7, FSH 27.4, AMH <0.015    Past Medical History:  Diagnosis Date   Hx of dilation and curettage 2015   Medical history non-contributory    Past Surgical History:  Procedure Laterality Date   CESAREAN SECTION  2015   Fetal demise with what sounds like abruptio placenta   DILATION AND CURETTAGE OF UTERUS  2015   The following portions of the patient's history were reviewed and updated as appropriate: allergies, current medications, past family history, past medical history, past social history, past surgical history and problem list.   Health Maintenance:      Component Value Date/Time   DIAGPAP  11/01/2020 0930    - Negative for intraepithelial lesion or malignancy (NILM)    HPVHIGH Negative 11/01/2020 0930   ADEQPAP  11/01/2020 0930    Satisfactory for evaluation; transformation zone component PRESENT.    High Risk HPV: Positive  Adequacy:  Satisfactory for evaluation, transformation zone component PRESENT  Diagnosis:  Atypical squamous cells of undetermined significance (ASC-US)   Review of Systems:  Pertinent items noted in HPI and remainder of comprehensive ROS otherwise negative.  Physical Exam:   General:  Alert, oriented and cooperative. Patient appears to be in no acute distress.  Mental Status: Normal mood and affect. Normal behavior. Normal judgment and thought content.   Respiratory: Normal respiratory effort, no problems with respiration noted  Rest of physical exam deferred due to type of encounter     Assessment and Plan:     1. Decreased sexual desire Increase bupropion to twice daily. If not improvement in desire can consider buspar or addyi (addyi contraindicated in pregnancy). Patient currently on antihypertensive medication  - buPROPion (WELLBUTRIN SR) 150 MG 12 hr tablet; Take 1 tablet (150 mg total) by mouth 2 (two) times daily.  Dispense: 60 tablet; Refill: 3  2. Irregular menses resolved  3. Infertility, female Referral previously placed, will follow up with [facility]. Discussed that negative AMH likely correlates with low success of spontaneous conception and would benefit from ART to conceive.        I discussed the assessment and treatment plan with the patient. The patient was provided an opportunity to ask questions and all were answered. The patient agreed with the plan and demonstrated an understanding  of the instructions.   The patient was advised to call back or seek an in-person evaluation/go to the ED if the symptoms worsen or if the condition fails to improve as anticipated.  I provided 5 minutes of face-to-face time during this encounter.   Lorriane Shire, MD [facility] [employer], [facility]

## 2023-04-20 ENCOUNTER — Other Ambulatory Visit: Payer: Self-pay | Admitting: Family Medicine

## 2023-04-21 DIAGNOSIS — L918 Other hypertrophic disorders of the skin: Secondary | ICD-10-CM | POA: Diagnosis not present

## 2023-04-21 DIAGNOSIS — E559 Vitamin D deficiency, unspecified: Secondary | ICD-10-CM | POA: Diagnosis not present

## 2023-04-21 DIAGNOSIS — E0500 Thyrotoxicosis with diffuse goiter without thyrotoxic crisis or storm: Secondary | ICD-10-CM | POA: Diagnosis not present

## 2023-04-27 ENCOUNTER — Encounter: Payer: Self-pay | Admitting: General Practice

## 2023-08-21 DIAGNOSIS — Z8639 Personal history of other endocrine, nutritional and metabolic disease: Secondary | ICD-10-CM | POA: Diagnosis not present

## 2023-08-21 DIAGNOSIS — D649 Anemia, unspecified: Secondary | ICD-10-CM | POA: Diagnosis not present

## 2023-09-02 ENCOUNTER — Other Ambulatory Visit: Payer: Self-pay | Admitting: Internal Medicine

## 2023-09-02 DIAGNOSIS — R946 Abnormal results of thyroid function studies: Secondary | ICD-10-CM

## 2023-09-07 IMAGING — MG MM DIGITAL SCREENING BILAT W/ TOMO AND CAD
8 series · 8 of 24 positions shown · non-contrast
Comparison: Previous exam(s).

CLINICAL DATA: Screening.

EXAM:
DIGITAL SCREENING BILATERAL MAMMOGRAM WITH TOMOSYNTHESIS AND CAD
TECHNIQUE: Bilateral screening digital craniocaudal and mediolateral oblique
mammograms were obtained. Bilateral screening digital breast
tomosynthesis was performed. The images were evaluated with
computer-aided detection.

[R MLO synth-2D]
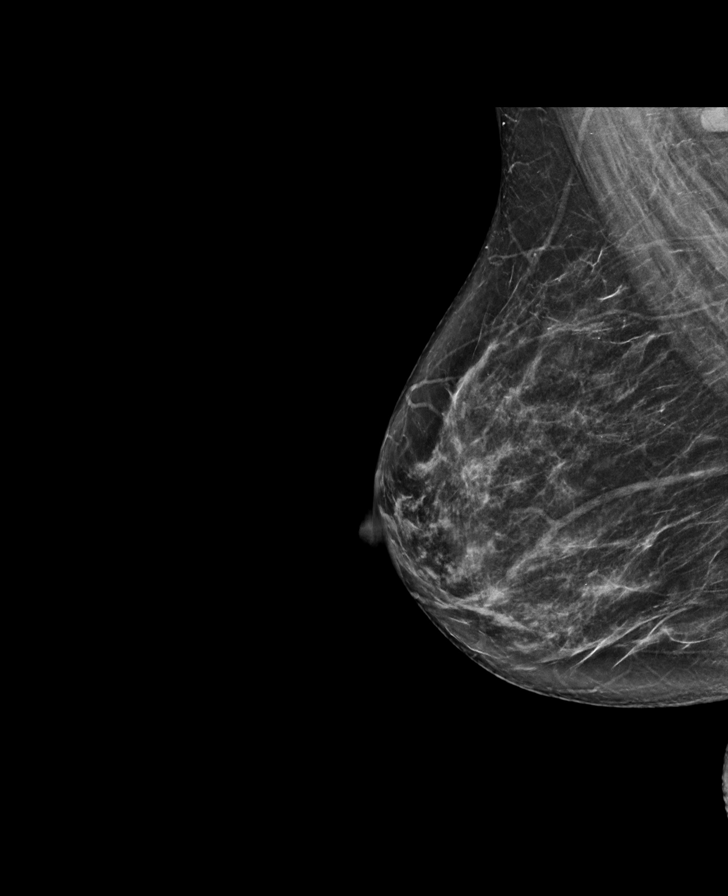

[L CC synth-2D]
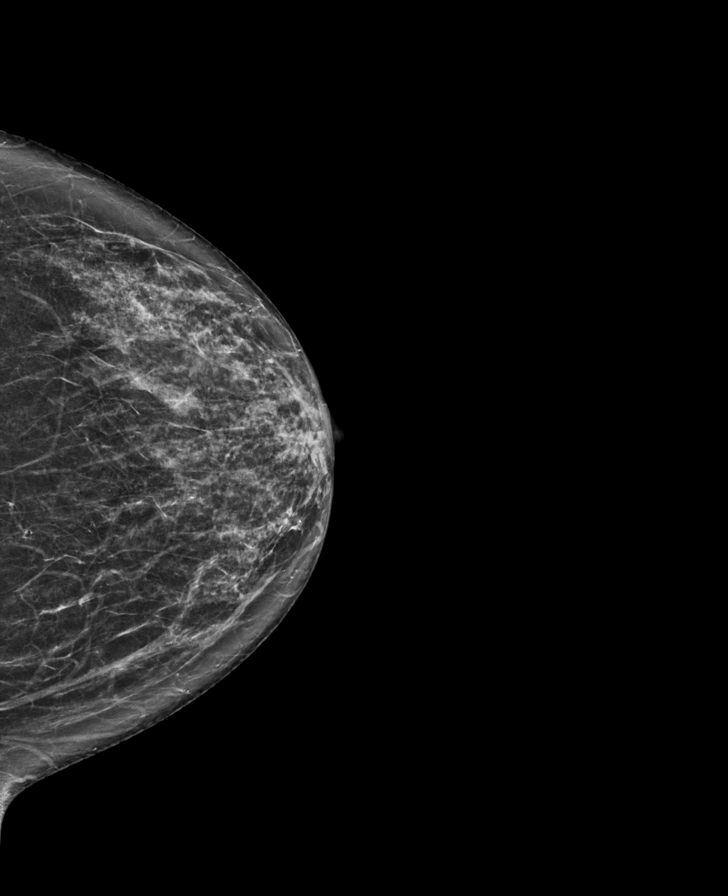

[L MLO synth-2D]
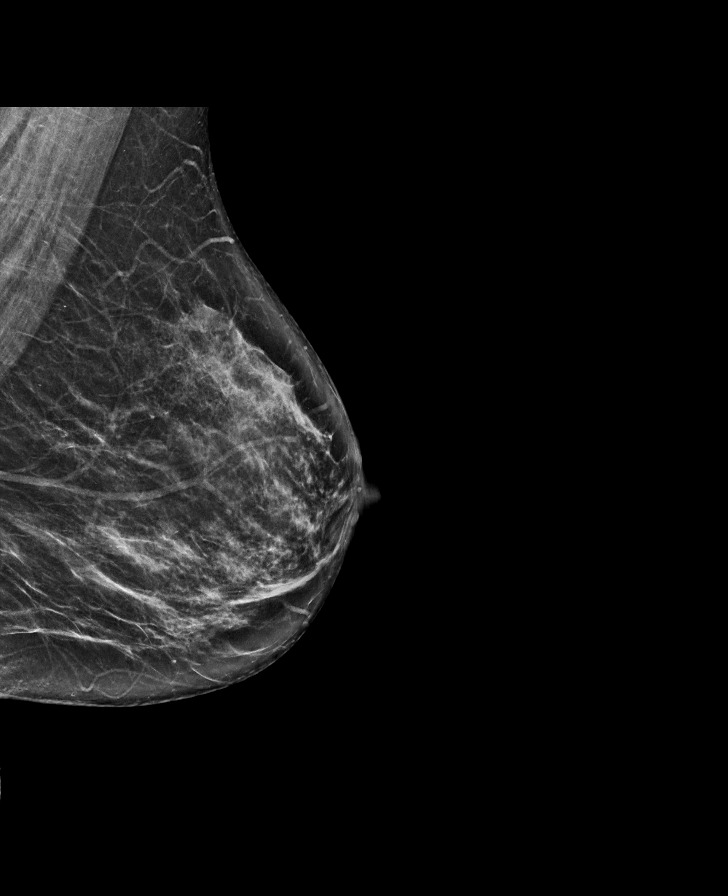

[R CC synth-2D]
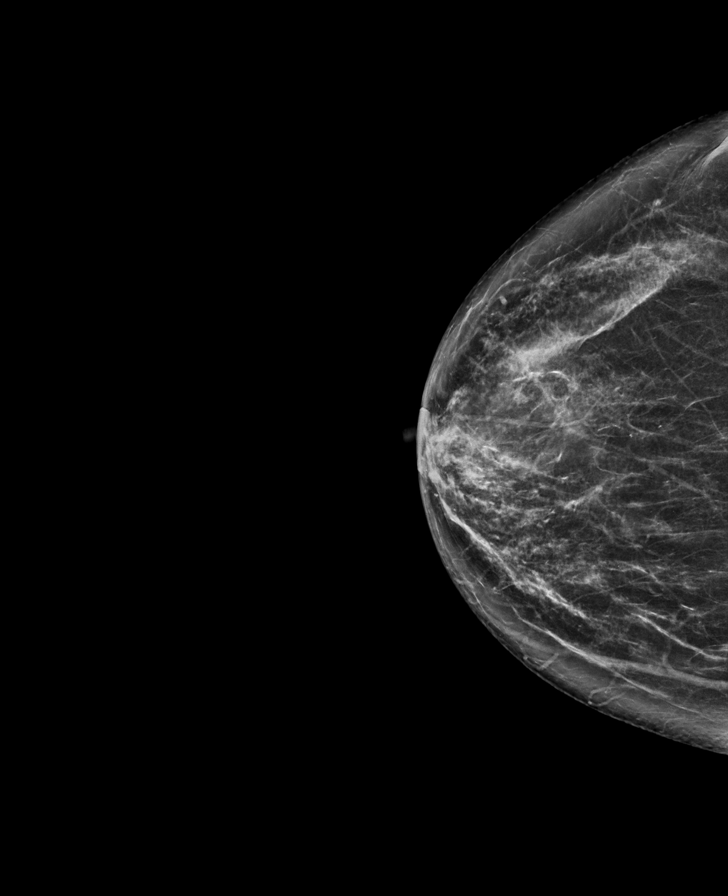

[R CC tomo · tomo slice 37/74.0]
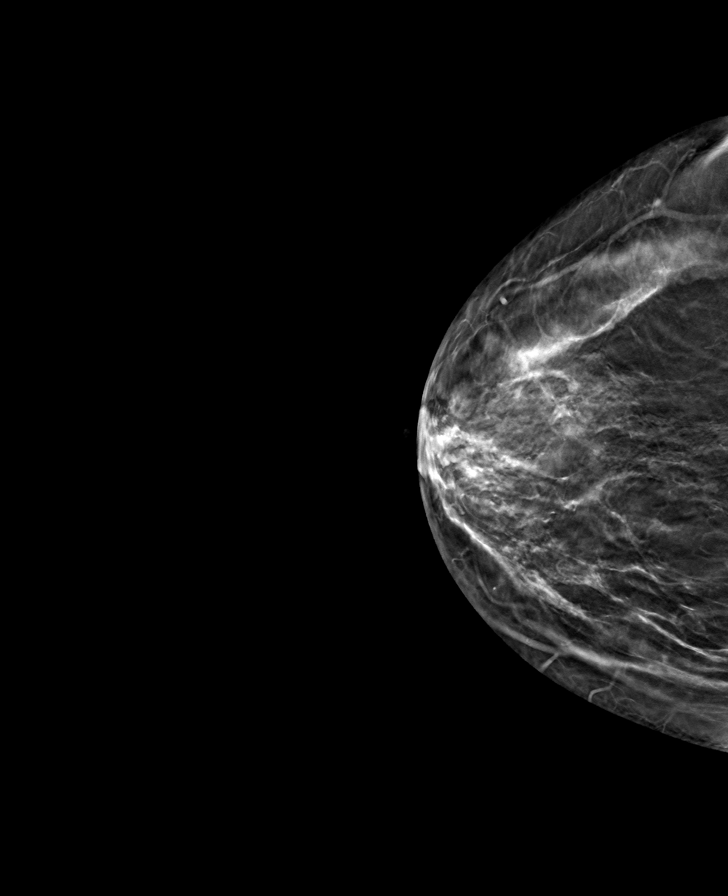

[R MLO tomo · tomo slice 37/74.0]
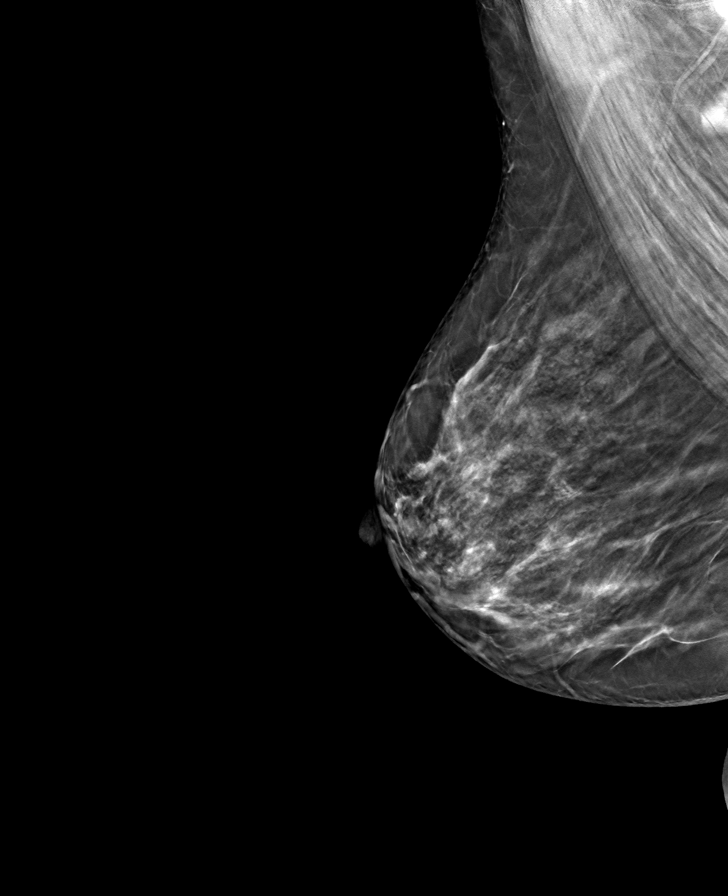

[L MLO tomo · tomo slice 35/69.0]
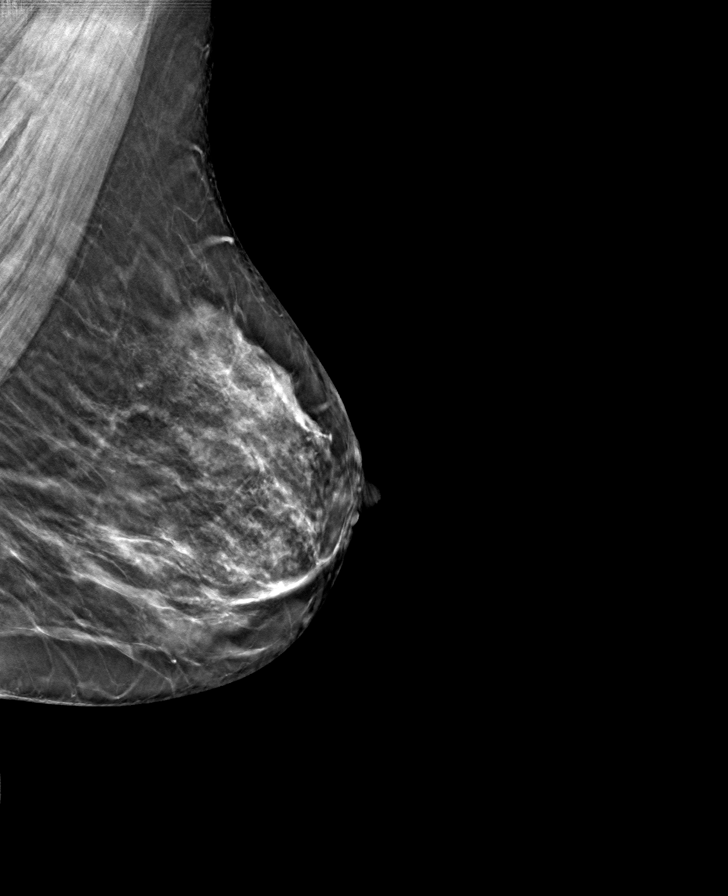

[L CC tomo · tomo slice 35/68.0]
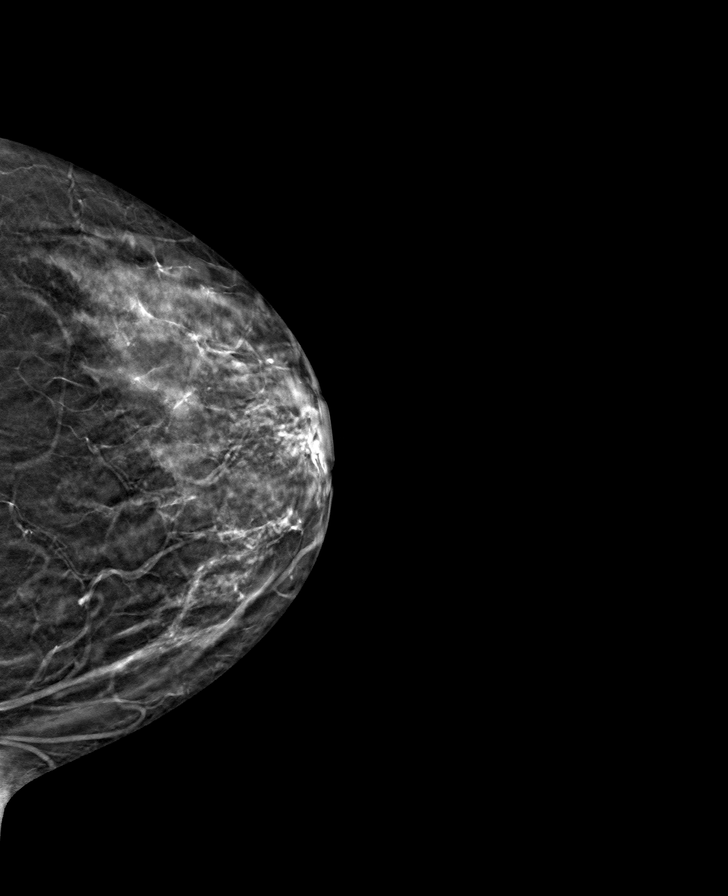

[8 of 24 positions shown; findings below may reference images not displayed]

ACR Breast Density Category c: The breast tissue is heterogeneously
dense, which may obscure small masses.
FINDINGS: There are no findings suspicious for malignancy.
IMPRESSION: No mammographic evidence of malignancy. A result letter of this
screening mammogram will be mailed directly to the patient.

RECOMMENDATION:
Screening mammogram in one year. (Code:Q3-W-BC3)

BI-RADS CATEGORY  1: Negative.

## 2023-09-08 ENCOUNTER — Ambulatory Visit
Admission: RE | Admit: 2023-09-08 | Discharge: 2023-09-08 | Disposition: A | Discharge status: Home / Self Care | Payer: BC Managed Care – PPO | Source: Ambulatory Visit | Attending: Internal Medicine | Admitting: Internal Medicine

## 2023-09-08 DIAGNOSIS — R946 Abnormal results of thyroid function studies: Secondary | ICD-10-CM

## 2024-01-21 DIAGNOSIS — R042 Hemoptysis: Secondary | ICD-10-CM | POA: Diagnosis not present

## 2024-01-21 DIAGNOSIS — E0590 Thyrotoxicosis, unspecified without thyrotoxic crisis or storm: Secondary | ICD-10-CM | POA: Diagnosis not present

## 2024-01-21 DIAGNOSIS — I10 Essential (primary) hypertension: Secondary | ICD-10-CM | POA: Diagnosis not present

## 2024-01-21 DIAGNOSIS — J301 Allergic rhinitis due to pollen: Secondary | ICD-10-CM | POA: Diagnosis not present

## 2024-02-03 DIAGNOSIS — E0590 Thyrotoxicosis, unspecified without thyrotoxic crisis or storm: Secondary | ICD-10-CM | POA: Diagnosis not present

## 2024-02-03 DIAGNOSIS — I10 Essential (primary) hypertension: Secondary | ICD-10-CM | POA: Diagnosis not present

## 2024-10-25 ENCOUNTER — Ambulatory Visit: Admitting: Physician Assistant
